# Patient Record
Sex: Female | Born: 1975
Health system: Southern US, Community
[De-identification: ages and names within clinical notes are randomized; demographics above are authoritative.]

## PROBLEM LIST (undated history)

## (undated) DIAGNOSIS — F0390 Unspecified dementia without behavioral disturbance: Secondary | ICD-10-CM

## (undated) DIAGNOSIS — M5030 Other cervical disc degeneration, unspecified cervical region: Secondary | ICD-10-CM

## (undated) DIAGNOSIS — G43909 Migraine, unspecified, not intractable, without status migrainosus: Secondary | ICD-10-CM

## (undated) DIAGNOSIS — I10 Essential (primary) hypertension: Secondary | ICD-10-CM

## (undated) DIAGNOSIS — F419 Anxiety disorder, unspecified: Secondary | ICD-10-CM

## (undated) HISTORY — DX: Migraine, unspecified, not intractable, without status migrainosus: G43.909

---

## 2002-03-02 ENCOUNTER — Encounter: Payer: Self-pay | Admitting: Emergency Medicine

## 2002-03-02 ENCOUNTER — Emergency Department (HOSPITAL_COMMUNITY): Admission: EM | Admit: 2002-03-02 | Discharge: 2002-03-02 | Payer: Self-pay | Admitting: Emergency Medicine

## 2003-02-24 ENCOUNTER — Emergency Department (HOSPITAL_COMMUNITY): Admission: EM | Admit: 2003-02-24 | Discharge: 2003-02-24 | Payer: Self-pay | Admitting: Emergency Medicine

## 2011-09-29 ENCOUNTER — Emergency Department (HOSPITAL_COMMUNITY): Payer: 59

## 2011-09-29 ENCOUNTER — Encounter (HOSPITAL_COMMUNITY): Payer: Self-pay | Admitting: Emergency Medicine

## 2011-09-29 ENCOUNTER — Emergency Department (HOSPITAL_COMMUNITY)
Admission: EM | Admit: 2011-09-29 | Discharge: 2011-09-29 | Disposition: A | Discharge status: Home / Self Care | Payer: 59 | Attending: Emergency Medicine | Admitting: Emergency Medicine

## 2011-09-29 DIAGNOSIS — F3289 Other specified depressive episodes: Secondary | ICD-10-CM | POA: Insufficient documentation

## 2011-09-29 DIAGNOSIS — Z79899 Other long term (current) drug therapy: Secondary | ICD-10-CM | POA: Insufficient documentation

## 2011-09-29 DIAGNOSIS — F17200 Nicotine dependence, unspecified, uncomplicated: Secondary | ICD-10-CM | POA: Insufficient documentation

## 2011-09-29 DIAGNOSIS — F329 Major depressive disorder, single episode, unspecified: Secondary | ICD-10-CM | POA: Insufficient documentation

## 2011-09-29 DIAGNOSIS — F419 Anxiety disorder, unspecified: Secondary | ICD-10-CM

## 2011-09-29 DIAGNOSIS — R209 Unspecified disturbances of skin sensation: Secondary | ICD-10-CM | POA: Insufficient documentation

## 2011-09-29 DIAGNOSIS — F411 Generalized anxiety disorder: Secondary | ICD-10-CM | POA: Insufficient documentation

## 2011-09-29 DIAGNOSIS — R259 Unspecified abnormal involuntary movements: Secondary | ICD-10-CM | POA: Insufficient documentation

## 2011-09-29 DIAGNOSIS — R079 Chest pain, unspecified: Secondary | ICD-10-CM | POA: Insufficient documentation

## 2011-09-29 DIAGNOSIS — N390 Urinary tract infection, site not specified: Secondary | ICD-10-CM | POA: Insufficient documentation

## 2011-09-29 HISTORY — DX: Anxiety disorder, unspecified: F41.9

## 2011-09-29 LAB — BASIC METABOLIC PANEL
Calcium: 9.4 mg/dL (ref 8.4–10.5)
Creatinine, Ser: 0.65 mg/dL (ref 0.50–1.10)
GFR calc non Af Amer: >90 mL/min (ref >90)
Glucose, Bld: 80 mg/dL (ref 70–99)
Sodium: 136 mEq/L (ref 135–145)

## 2011-09-29 LAB — URINALYSIS, ROUTINE W REFLEX MICROSCOPIC
Bilirubin Urine: NEGATIVE
Glucose, UA: NEGATIVE mg/dL
Ketones, ur: NEGATIVE mg/dL
Protein, ur: NEGATIVE mg/dL
pH: 7.5 (ref 5.0–8.0)

## 2011-09-29 LAB — URINE MICROSCOPIC-ADD ON

## 2011-09-29 LAB — POCT I-STAT TROPONIN I

## 2011-09-29 MED ORDER — LORAZEPAM 1 MG PO TABS: 1.0000 mg | ORAL_TABLET | Freq: Three times a day (TID) | ORAL | Status: AC

## 2011-09-29 MED ORDER — LORAZEPAM 2 MG/ML IJ SOLN
1.0000 mg | Freq: Once | INTRAMUSCULAR | Status: AC
  Administered 2011-09-29: 1 mg via INTRAVENOUS
  Filled 2011-09-29: qty 1

## 2011-09-29 MED ORDER — CEPHALEXIN 500 MG PO CAPS: 500.0000 mg | ORAL_CAPSULE | Freq: Four times a day (QID) | ORAL | Status: AC

## 2011-09-29 NOTE — ED Notes (Signed)
Patient tearful, stating she has intermittent, sharp chest pain since yesterday. Patient reports h/o anxiety. Appears anxious. IV initiated and 1 mg ativan given per order. Patient lying on stretcher, family at bedside. Denies any other needs at this time.

## 2011-09-29 NOTE — ED Provider Notes (Signed)
History     CSN: [id number]  Arrival date & time 09/29/11  1055   First MD Initiated Contact with Patient 09/29/11 1102      Chief Complaint  Patient presents with  . Chest Pain    (Consider location/radiation/quality/duration/timing/severity/associated sxs/prior treatment) HPI Comments: Jamie Henderson presents for assistance with intermittent sharp chest pain, shaking, crying and anxiety which has been worsened since yesterday.  She has been diagnosed with anxiety and depression last month by her PCP and had been on Celexa and Xanax, but is currently being weaned off of Celexa for replacement with Zoloft at the Celexa has not seemed to have improved her symptoms.  She reports increased stressors including recent separation, she is currently living with her parent in her children, and is also unable to obtain gainful employment.  She reports intermittent sharp stabs of very localized pain which radiates which radiates into her left upper shoulder which has been intermittent but more frequent over the past 24 hours.  She denies any sharp pain today, but she does report soreness to palpation at the site.  She has intermittent episodes of shortness of breath in association with numbness in her fingertips.  She denies dizziness or lightheadedness, no fevers or chills, diaphoresis.  She does have chronic nausea but denies abdominal pain.  She has decreased appetite but this is chronic since being placed on her Celexa.  She denies any significant weight loss.  Past medical history is otherwise negative.  Family history is significant for mother with anxiety but also with thyroid disorder.  Patient is a 36 y.o. female presenting with chest pain. The history is provided by the patient.  Chest Pain Pertinent negatives for primary symptoms include no fever, no shortness of breath, no abdominal pain, no nausea and no dizziness.  Associated symptoms include numbness.  Pertinent negatives for associated symptoms  include no weakness.     Past Medical History  Diagnosis Date  . Anxiety     Past Surgical History  Procedure Date  . Cesarean section     History reviewed. No pertinent family history.  History  Substance Use Topics  . Smoking status: Current Everyday Smoker    Types: Cigarettes  . Smokeless tobacco: Not on file  . Alcohol Use: No    OB History    Grav Para Term Preterm Abortions TAB SAB Ect Mult Living                  Review of Systems  Constitutional: Negative for fever.  HENT: Negative for congestion, sore throat and neck pain.   Eyes: Negative.   Respiratory: Negative for chest tightness and shortness of breath.   Cardiovascular: Positive for chest pain.  Gastrointestinal: Negative for nausea and abdominal pain.  Genitourinary: Negative.   Musculoskeletal: Negative for joint swelling and arthralgias.  Skin: Negative.  Negative for rash and wound.  Neurological: Positive for tremors and numbness. Negative for dizziness, weakness, light-headedness and headaches.  Hematological: Negative.   Psychiatric/Behavioral: The patient is nervous/anxious.     Allergies  Review of patient's allergies indicates no known allergies.  Home Medications   Current Outpatient Rx  Name Route Sig Dispense Refill  . ALPRAZOLAM 0.5 MG PO TABS Oral Take 0.5 mg by mouth every 6 (six) hours as needed. For panic or anxiety    . MAGIC MOUTHWASH Oral Take 10 mLs by mouth 3 (three) times daily.    Marland Kitchen CARBOXYMETHYLCELLULOSE SODIUM 0.5 % OP SOLN Both Eyes Place 1  drop into both eyes 2 (two) times daily as needed. For dry eye    . CITALOPRAM HYDROBROMIDE 20 MG PO TABS Oral Take 10 mg by mouth daily.    . SERTRALINE HCL 50 MG PO TABS Oral Take 50 mg by mouth daily.    . CEPHALEXIN 500 MG PO CAPS Oral Take 1 capsule (500 mg total) by mouth 4 (four) times daily. 20 capsule 0  . LORAZEPAM 1 MG PO TABS Oral Take 1 tablet (1 mg total) by mouth every 8 (eight) hours. 6 tablet 0    BP 122/80   Pulse 72  Temp(Src) 99.1 F (37.3 C) (Oral)  Resp 20  Ht 5' 2.5" (1.588 m)  Wt 133 lb 8 oz (60.555 kg)  BMI 24.03 kg/m2  SpO2 100%  LMP 09/16/2011  Physical Exam  Nursing note and vitals reviewed. Constitutional: She appears well-developed and well-nourished.  HENT:  Head: Normocephalic and atraumatic.  Eyes: Conjunctivae are normal.  Neck: Normal range of motion.  Cardiovascular: Normal rate, regular rhythm, normal heart sounds and intact distal pulses.   Pulmonary/Chest: Effort normal and breath sounds normal. She has no wheezes.  Abdominal: Soft. Bowel sounds are normal. There is no tenderness.  Musculoskeletal: Normal range of motion.  Neurological: She is alert. She displays tremor.  Skin: Skin is warm and dry.  Psychiatric: Her behavior is normal. Thought content normal. Her mood appears anxious. Her speech is not rapid and/or pressured. She exhibits a depressed mood. She expresses no homicidal and no suicidal ideation.       Patient is tearful.    ED Course  Procedures (including critical care time)  Labs Reviewed  BASIC METABOLIC PANEL - Abnormal; Notable for the following:    BUN 4 (*)    All other components within normal limits  URINALYSIS, ROUTINE W REFLEX MICROSCOPIC - Abnormal; Notable for the following:    Specific Gravity, Urine <1.005 (*)    Nitrite POSITIVE (*)    Leukocytes, UA TRACE (*)    All other components within normal limits  URINE MICROSCOPIC-ADD ON - Abnormal; Notable for the following:    Squamous Epithelial / LPF FEW (*)    Bacteria, UA MANY (*)    All other components within normal limits  PREGNANCY, URINE  POCT I-STAT TROPONIN I  TSH   Dg Chest 2 View  09/29/2011  *RADIOLOGY REPORT*  Clinical Data: Chest pain.  CHEST - 2 VIEW  Comparison: No priors.  Findings: Lungs appear mildly hyperexpanded.  No focal airspace consolidation.  No pleural effusions.  Pulmonary vasculature and the cardiomediastinal silhouette are within normal limits.   IMPRESSION: 1.  Mild hyperexpansion of the lungs without other acute findings. This is nonspecific, but can be seen in the setting of reactive airway disease.  Original Report Authenticated By: Florencia Reasons, M.D.     1. UTI (lower urinary tract infection)   2. Anxiety      Date: 09/29/2011  Rate: 72  Rhythm: normal sinus rhythm  QRS Axis: normal  Intervals: normal  ST/T Wave abnormalities: normal  Conduction Disutrbances:none  Narrative Interpretation:   Old EKG Reviewed: none available  Patient feels much improved after receiving ativan 1 mg PO.     MDM  Patient is perc negative and with normal vital signs,  Making PE far less likely than anxiety and stress as source of sx.    Xrays and labs reviewed prior to dc home.  Keflex prescribed for uti,  Encouraged increasing fluids.  Recheck for any fevers or development of flank pain and/or vomiting.  Pt given short course of ativan in place of xanax and instructed to call pcp in 2 days (Monday) for a recheck of sx.         Burgess Amor, [place] 09/29/11 [phone]

## 2011-09-29 NOTE — ED Notes (Signed)
Patient stating she is feeling better and "is tired of laying in this bed". Burgess Amor, PA made aware.

## 2011-09-29 NOTE — ED Notes (Signed)
Pt c/o central chest pain that is sharp since yesterday. Pt states she was recently diagnosed with anxiety/panic attacks.

## 2011-09-29 NOTE — Discharge Instructions (Signed)
Anxiety and Panic Attacks Your caregiver has informed you that you are having an anxiety or panic attack. There may be many forms of this. Most of the time these attacks come suddenly and without warning. They come at any time of [place], including periods of sleep, and at any time of life. They may be strong and unexplained. Although panic attacks are very scary, they are physically harmless. Sometimes the cause of your anxiety is not known. Anxiety is a protective mechanism of the body in its fight or flight mechanism. Most of these perceived danger situations are actually nonphysical situations (such as anxiety over losing a job). CAUSES  The causes of an anxiety or panic attack are many. Panic attacks may occur in otherwise healthy people given a certain set of circumstances. There may be a genetic cause for panic attacks. Some medications may also have anxiety as a side effect. SYMPTOMS  Some of the most common feelings are:  Intense terror.   Dizziness, feeling faint.   Hot and cold flashes.   Fear of going crazy.   Feelings that nothing is real.   Sweating.   Shaking.   Chest pain or a fast heartbeat (palpitations).   Smothering, choking sensations.   Feelings of impending doom and that death is near.   Tingling of extremities, this may be from over-breathing.   Altered reality (derealization).   Being detached from yourself (depersonalization).  Several symptoms can be present to make up anxiety or panic attacks. DIAGNOSIS  The evaluation by your caregiver will depend on the type of symptoms you are experiencing. The diagnosis of anxiety or panic attack is made when no physical illness can be determined to be a cause of the symptoms. TREATMENT  Treatment to prevent anxiety and panic attacks may include:  Avoidance of circumstances that cause anxiety.   Reassurance and relaxation.   Regular exercise.   Relaxation therapies, such as yoga.   Psychotherapy with a  psychiatrist or therapist.   Avoidance of caffeine, alcohol and illegal drugs.   Prescribed medication.  SEEK IMMEDIATE MEDICAL CARE IF:   You experience panic attack symptoms that are different than your usual symptoms.   You have any worsening or concerning symptoms.  Document Released: 04/30/2005 Document Revised: 04/19/2011 Document Reviewed: 09/01/2009 [facility] Patient Information 2012 [place], [place].Urinary Tract Infection Infections of the urinary tract can start in several places. A bladder infection (cystitis), a kidney infection (pyelonephritis), and a prostate infection (prostatitis) are different types of urinary tract infections (UTIs). They usually get better if treated with medicines (antibiotics) that kill germs. Take all the medicine until it is gone. You or your child may feel better in a few days, but TAKE ALL MEDICINE or the infection may not respond and may become more difficult to treat. HOME CARE INSTRUCTIONS   Drink enough water and fluids to keep the urine clear or pale yellow. Cranberry juice is especially recommended, in addition to large amounts of water.   Avoid caffeine, tea, and carbonated beverages. They tend to irritate the bladder.   Alcohol may irritate the prostate.   Only take over-the-counter or prescription medicines for pain, discomfort, or fever as directed by your caregiver.  To prevent further infections:  Empty the bladder often. Avoid holding urine for long periods of time.   After a bowel movement, women should cleanse from front to back. Use each tissue only once.   Empty the bladder before and after sexual intercourse.  FINDING OUT THE RESULTS OF YOUR  TEST Not all test results are available during your visit. If your or your child's test results are not back during the visit, make an appointment with your caregiver to find out the results. Do not assume everything is normal if you have not heard from your caregiver or the medical  facility. It is important for you to follow up on all test results. SEEK MEDICAL CARE IF:   There is back pain.   Your baby is older than 3 months with a rectal temperature of 100.5 F (38.1 C) or higher for more than 1 Sche[place].   Your or your child's problems (symptoms) are no better in 3 days. Return sooner if you or your child is getting worse.  SEEK IMMEDIATE MEDICAL CARE IF:   There is severe back pain or lower abdominal pain.   You or your child develops chills.   You have a fever.   Your baby is older than 3 months with a rectal temperature of 102 F (38.9 C) or higher.   Your baby is 56 months old or younger with a rectal temperature of 100.4 F (38 C) or higher.   There is nausea or vomiting.   There is continued burning or discomfort with urination.  MAKE SURE YOU:   Understand these instructions.   Will watch your condition.   Will get help right away if you are not doing well or get worse.  Document Released: 02/07/2005 Document Revised: 04/19/2011 Document Reviewed: 09/12/2006 [facility] Patient Information 2012 [place], [place].   You may use the Ativan as prescribed in place of your Xanax for the next couple of days which may give you increased relief from your anxiety.  Use the antibiotic as prescribed to clear your urinary tract infection.  Make sure you're drinking plenty of fluids.   call you doctor for lab visit to recheck your urine after your antibiotic is completed, in the interim get rechecked sooner for any increased urinary symptoms or fever.  Please refer to the resource guide below for obtaining additional care for your anxiety, you may also ask Dr. Neita Carp to be referred to a counselor for this.  In the interim return here if you have any worsened symptoms.     RESOURCE GUIDE  Dental Problems  Patients with Medicaid: [facility] [phone] [address]                                           [phone] [address]  [employer] Phone:  [phone]                                                  Phone:  [phone]  If unable to pay or uninsured, contact:  [facility] or [facility]. to become qualified for the adult dental clinic.  Chronic Pain Problems Contact [facility]  [phone] Patients need to be referred by their primary care doctor.  Insufficient Money for Medicine Contact [employer]:  call "211" or [facility] [phone].  No Primary Care Doctor Call [facility]  [phone] Other agencies that provide  inexpensive medical care    [facility]  [phone]    [facility]  [phone]    [facility]  [phone]    [facility]  [phone]    [facility]  3126456945    [facility]  [phone]  Psychological Services [facility]  [phone] [facility]  [phone] [facility]   [phone] (emergency services [phone])  Substance Abuse Resources [facility]  8608673257 [facility] [phone] [facility] [place] [phone] [facility] [phone] Residential & Outpatient Substance Abuse Program  [phone]  Abuse/Neglect [facility] Child Abuse Hotline (701)651-3994 [facility] Child Abuse Hotline (854) 619-8255 (After Hours)  Emergency Shelter [facility] ([phone]  Maternity Homes Room at the [place] [facility] [place] [phone] [facility] [phone]  MRSA Hotline #:   (272) 070-0479    [facility] Resources  [facility] [place]     [employer]                          [facility] Dept. [address] [place]                       [address]      [address]  Blondell Reveal Phone:  [phone]                                   Phone:  [phone]                  Phone:  [phone]  [facility] Phone:  [phone]  [facility] Child Abuse Hotline [phone] (After Hours)

## 2011-09-29 NOTE — ED Notes (Signed)
EDP at bedside. Discussed plan of care and follow up instructions. Pt verbalized understanding.

## 2011-09-30 NOTE — ED Provider Notes (Signed)
Medical screening examination/treatment/procedure(s) were performed by non-physician practitioner and as supervising physician I was immediately available for consultation/collaboration.  Results for orders placed during the hospital encounter of 09/29/11  BASIC METABOLIC PANEL      Component Value Range   Sodium 136  135 - 145 (mEq/L)   Potassium 3.7  3.5 - 5.1 (mEq/L)   Chloride 103  96 - 112 (mEq/L)   CO2 24  19 - 32 (mEq/L)   Glucose, Bld 80  70 - 99 (mg/dL)   BUN 4 (*) 6 - 23 (mg/dL)   Creatinine, Ser 1.61  0.50 - 1.10 (mg/dL)   Calcium 9.4  8.4 - 09.6 (mg/dL)   GFR calc non Af Amer >90  >90 (mL/min)   GFR calc Af Amer >90  >90 (mL/min)  URINALYSIS, ROUTINE W REFLEX MICROSCOPIC      Component Value Range   Color, Urine YELLOW  YELLOW    APPearance CLEAR  CLEAR    Specific Gravity, Urine <1.005 (*) 1.005 - 1.030    pH 7.5  5.0 - 8.0    Glucose, UA NEGATIVE  NEGATIVE (mg/dL)   Hgb urine dipstick NEGATIVE  NEGATIVE    Bilirubin Urine NEGATIVE  NEGATIVE    Ketones, ur NEGATIVE  NEGATIVE (mg/dL)   Protein, ur NEGATIVE  NEGATIVE (mg/dL)   Urobilinogen, UA 0.2  0.0 - 1.0 (mg/dL)   Nitrite POSITIVE (*) NEGATIVE    Leukocytes, UA TRACE (*) NEGATIVE   PREGNANCY, URINE      Component Value Range   Preg Test, Ur NEGATIVE  NEGATIVE   POCT I-STAT TROPONIN I      Component Value Range   Troponin i, poc 0.00  0.00 - 0.08 (ng/mL)   Comment 3           URINE MICROSCOPIC-ADD ON      Component Value Range   Squamous Epithelial / LPF FEW (*) RARE    WBC, UA 3-6  <3 (WBC/hpf)   Bacteria, UA MANY (*) RARE    Tn negative.     Shelda Jakes, MD 09/30/11 [phone]

## 2012-03-22 ENCOUNTER — Encounter (HOSPITAL_COMMUNITY): Payer: Self-pay | Admitting: *Deleted

## 2012-03-22 ENCOUNTER — Emergency Department (HOSPITAL_COMMUNITY)
Admission: EM | Admit: 2012-03-22 | Discharge: 2012-03-22 | Disposition: A | Discharge status: Home / Self Care | Payer: MEDICAID | Attending: Emergency Medicine | Admitting: Emergency Medicine

## 2012-03-22 DIAGNOSIS — F411 Generalized anxiety disorder: Secondary | ICD-10-CM | POA: Insufficient documentation

## 2012-03-22 DIAGNOSIS — G44309 Post-traumatic headache, unspecified, not intractable: Secondary | ICD-10-CM

## 2012-03-22 DIAGNOSIS — Z79899 Other long term (current) drug therapy: Secondary | ICD-10-CM | POA: Insufficient documentation

## 2012-03-22 DIAGNOSIS — F0781 Postconcussional syndrome: Secondary | ICD-10-CM | POA: Insufficient documentation

## 2012-03-22 DIAGNOSIS — F17200 Nicotine dependence, unspecified, uncomplicated: Secondary | ICD-10-CM | POA: Insufficient documentation

## 2012-03-22 DIAGNOSIS — R42 Dizziness and giddiness: Secondary | ICD-10-CM | POA: Insufficient documentation

## 2012-03-22 DIAGNOSIS — M6281 Muscle weakness (generalized): Secondary | ICD-10-CM | POA: Insufficient documentation

## 2012-03-22 DIAGNOSIS — R112 Nausea with vomiting, unspecified: Secondary | ICD-10-CM | POA: Insufficient documentation

## 2012-03-22 MED ORDER — ONDANSETRON HCL 4 MG/2ML IJ SOLN
4.0000 mg | Freq: Once | INTRAMUSCULAR | Status: AC
  Administered 2012-03-22: 4 mg via INTRAVENOUS
  Filled 2012-03-22: qty 2

## 2012-03-22 MED ORDER — ONDANSETRON HCL 8 MG PO TABS: 8.0000 mg | ORAL_TABLET | Freq: Four times a day (QID) | ORAL | Status: DC

## 2012-03-22 MED ORDER — SODIUM CHLORIDE 0.9 % IV BOLUS (SEPSIS)
1000.0000 mL | Freq: Once | INTRAVENOUS | Status: AC
  Administered 2012-03-22: 1000 mL via INTRAVENOUS

## 2012-03-22 MED ORDER — FENTANYL CITRATE 0.05 MG/ML IJ SOLN
100.0000 ug | Freq: Once | INTRAMUSCULAR | Status: AC
  Administered 2012-03-22: 100 ug via INTRAVENOUS
  Filled 2012-03-22: qty 2
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
## 2012-03-22 NOTE — ED Notes (Signed)
Tolerating fluids w/out nausea.
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
## 2012-03-24 NOTE — ED Provider Notes (Signed)
Medical screening examination/treatment/procedure(s) were performed by non-physician practitioner and as supervising physician I was immediately available for consultation/collaboration.   Laray Anger, DO 03/24/12 1346

## 2013-04-21 ENCOUNTER — Ambulatory Visit (INDEPENDENT_AMBULATORY_CARE_PROVIDER_SITE_OTHER): Payer: Medicaid Other | Admitting: General Practice

## 2013-04-21 ENCOUNTER — Encounter: Payer: Self-pay | Admitting: General Practice

## 2013-04-21 VITALS — BP 129/88 | HR 78 | Temp 99.7°F | Ht 62.5 in | Wt 121.5 lb

## 2013-04-21 DIAGNOSIS — F329 Major depressive disorder, single episode, unspecified: Secondary | ICD-10-CM

## 2013-04-21 DIAGNOSIS — G43909 Migraine, unspecified, not intractable, without status migrainosus: Secondary | ICD-10-CM

## 2013-04-21 DIAGNOSIS — K219 Gastro-esophageal reflux disease without esophagitis: Secondary | ICD-10-CM

## 2013-04-21 DIAGNOSIS — Z7189 Other specified counseling: Secondary | ICD-10-CM

## 2013-04-21 DIAGNOSIS — F411 Generalized anxiety disorder: Secondary | ICD-10-CM

## 2013-04-21 DIAGNOSIS — Z7689 Persons encountering health services in other specified circumstances: Secondary | ICD-10-CM

## 2013-04-21 MED ORDER — OMEPRAZOLE 20 MG PO CPDR: DELAYED_RELEASE_CAPSULE | ORAL | Status: DC

## 2013-04-21 MED ORDER — PROPRANOLOL HCL 10 MG PO TABS: 10.0000 mg | ORAL_TABLET | Freq: Two times a day (BID) | ORAL | Status: DC

## 2013-04-21 NOTE — Patient Instructions (Signed)
Migraine Headache A migraine headache is an intense, throbbing pain on one or both sides of your head. A migraine can last for 30 minutes to several hours. CAUSES  The exact cause of a migraine headache is not always known. However, a migraine may be caused when nerves in the brain become irritated and release chemicals that cause inflammation. This causes pain. SYMPTOMS  Pain on one or both sides of your head.  Pulsating or throbbing pain.  Severe pain that prevents daily activities.  Pain that is aggravated by any physical activity.  Nausea, vomiting, or both.  Dizziness.  Pain with exposure to bright lights, loud noises, or activity.  General sensitivity to bright lights, loud noises, or smells. Before you get a migraine, you may get warning signs that a migraine is coming (aura). An aura may include:  Seeing flashing lights.  Seeing bright spots, halos, or zig-zag lines.  Having tunnel vision or blurred vision.  Having feelings of numbness or tingling.  Having trouble talking.  Having muscle weakness. MIGRAINE TRIGGERS  Alcohol.  Smoking.  Stress.  Menstruation.  Aged cheeses.  Foods or drinks that contain nitrates, glutamate, aspartame, or tyramine.  Lack of sleep.  Chocolate.  Caffeine.  Hunger.  Physical exertion.  Fatigue.  Medicines used to treat chest pain (nitroglycerine), birth control pills, estrogen, and some blood pressure medicines. DIAGNOSIS  A migraine headache is often diagnosed based on:  Symptoms.  Physical examination.  A CT scan or MRI of your head. TREATMENT Medicines may be given for pain and nausea. Medicines can also be given to help prevent recurrent migraines.  HOME CARE INSTRUCTIONS  Only take over-the-counter or prescription medicines for pain or discomfort as directed by your caregiver. The use of long-term narcotics is not recommended.  Lie down in a dark, quiet room when you have a migraine.  Keep a journal  to find out what may trigger your migraine headaches. For example, write down:  What you eat and drink.  How much sleep you get.  Any change to your diet or medicines.  Limit alcohol consumption.  Quit smoking if you smoke.  Get 7 to 9 hours of sleep, or as recommended by your caregiver.  Limit stress.  Keep lights dim if bright lights bother you and make your migraines worse. SEEK IMMEDIATE MEDICAL CARE IF:   Your migraine becomes severe.  You have a fever.  You have a stiff neck.  You have vision loss.  You have muscular weakness or loss of muscle control.  You start losing your balance or have trouble walking.  You feel faint or pass out.  You have severe symptoms that are different from your first symptoms. MAKE SURE YOU:   Understand these instructions.  Will watch your condition.  Will get help right away if you are not doing well or get worse. Document Released: 04/30/2005 Document Revised: 07/23/2011 Document Reviewed: 04/20/2011 [employer] Patient Information 2014 [employer].  

## 2013-04-21 NOTE — Progress Notes (Signed)
   Subjective:    Patient ID: Jamie [facility], female    DOB: 03/23/1976, 37 y.o.   MRN: [id number]  HPI Patient presents today to establish care. She reports a history of migraine headaches, anxiety, depression, and gerd. She reports being seen by [facility] and another family practice prior to that. Reports being seen in the past by [facility], but wasn't happy with medications prescribed. [facility] also reports being seen at a [facility] in [place] and again wasn't satisfied with medications prescribed. She reports currently taking paroxetine, propranolol, and clonazepam. She verbalized the clonazepam isn't effective and she takes more frequently than prescribed; therefore needs script filled sooner than allowed. Reports xanax was most effective for her, but was changed by one of the counseling centers she was seen at.     Review of Systems  Constitutional: Negative for fever and chills.  Respiratory: Negative for chest tightness and shortness of breath.   Cardiovascular: Negative for chest pain and palpitations.  Gastrointestinal: Negative for nausea, vomiting, abdominal pain, diarrhea, constipation and blood in stool.  Genitourinary: Negative for dysuria and difficulty urinating.  Neurological: Negative for dizziness, weakness and headaches.  Psychiatric/Behavioral: Negative for suicidal ideas and self-injury. The patient is nervous/anxious.        Objective:   Physical Exam  Constitutional: She is oriented to person, place, and time. She appears well-developed and well-nourished.  HENT:  Head: Normocephalic and atraumatic.  Right Ear: External ear normal.  Left Ear: External ear normal.  Eyes: EOM are normal. Pupils are equal, round, and reactive to light.  Neck: Normal range of motion. Neck supple. No thyromegaly present.  Cardiovascular: Normal rate, regular rhythm and normal heart sounds.   Pulmonary/Chest: Effort normal and breath sounds normal. No respiratory  distress. She exhibits no tenderness.  Abdominal: Soft. Bowel sounds are normal. She exhibits no distension. There is no tenderness.  Lymphadenopathy:    She has no cervical adenopathy.  Neurological: She is alert and oriented to person, place, and time.  Skin: Skin is warm and dry.  Psychiatric: She has a normal mood and affect.          Assessment & Plan:  1. GAD (generalized anxiety disorder), 2. Depression -discussed and provided information sheets of counseling centers -patient to contact [facility]  -discussed that medication to help manage depression and anxiety should be managed by [facility]  3. Migraine  - propranolol (INDERAL) 10 MG tablet; Take 1 tablet (10 mg total) by mouth 2 (two) times daily.  Dispense: 60 tablet; Refill: 5  4. GERD (gastroesophageal reflux disease)  - omeprazole (PRILOSEC) 20 MG capsule; Take 20 mg by mouth daily. One capsule every AM before eating  Dispense: 30 capsule; Refill: 5  5. Establish care Discussed with patient that acute care issues and wellness exam could be managed at this office -RTO if symptoms worsen -Patient verbalized understanding and in agreement Jamie Keens, FNP-C

## 2013-05-13 ENCOUNTER — Emergency Department (HOSPITAL_COMMUNITY): Payer: Medicaid Other

## 2013-05-13 ENCOUNTER — Encounter (HOSPITAL_COMMUNITY): Payer: Self-pay | Admitting: Emergency Medicine

## 2013-05-13 ENCOUNTER — Emergency Department (HOSPITAL_COMMUNITY)
Admission: EM | Admit: 2013-05-13 | Discharge: 2013-05-13 | Disposition: A | Discharge status: Home / Self Care | Payer: Medicaid Other | Attending: Emergency Medicine | Admitting: Emergency Medicine

## 2013-05-13 DIAGNOSIS — F17200 Nicotine dependence, unspecified, uncomplicated: Secondary | ICD-10-CM | POA: Insufficient documentation

## 2013-05-13 DIAGNOSIS — M5412 Radiculopathy, cervical region: Secondary | ICD-10-CM

## 2013-05-13 DIAGNOSIS — R202 Paresthesia of skin: Secondary | ICD-10-CM

## 2013-05-13 DIAGNOSIS — G43909 Migraine, unspecified, not intractable, without status migrainosus: Secondary | ICD-10-CM | POA: Insufficient documentation

## 2013-05-13 DIAGNOSIS — Z79899 Other long term (current) drug therapy: Secondary | ICD-10-CM | POA: Insufficient documentation

## 2013-05-13 DIAGNOSIS — F411 Generalized anxiety disorder: Secondary | ICD-10-CM | POA: Insufficient documentation

## 2013-05-13 DIAGNOSIS — R209 Unspecified disturbances of skin sensation: Secondary | ICD-10-CM | POA: Insufficient documentation

## 2013-05-13 DIAGNOSIS — M6281 Muscle weakness (generalized): Secondary | ICD-10-CM | POA: Insufficient documentation

## 2013-05-13 LAB — CBC WITH DIFFERENTIAL/PLATELET
Basophils Absolute: 0.1 10*3/uL (ref 0.0–0.1)
Eosinophils Relative: 2 % (ref 0–5)
Lymphocytes Relative: 25 % (ref 12–46)
Lymphs Abs: 2.1 10*3/uL (ref 0.7–4.0)
MCV: 79.8 fL (ref 78.0–100.0)
Neutro Abs: 5.8 10*3/uL (ref 1.7–7.7)
Neutrophils Relative %: 67 % (ref 43–77)
Platelets: 233 10*3/uL (ref 150–400)
RBC: 4.25 MIL/uL (ref 3.87–5.11)
RDW: 15 % (ref 11.5–15.5)
WBC: 8.6 10*3/uL (ref 4.0–10.5)

## 2013-05-13 LAB — BASIC METABOLIC PANEL
CO2: 25 mEq/L (ref 19–32)
Calcium: 9.2 mg/dL (ref 8.4–10.5)
Chloride: 102 mEq/L (ref 96–112)
Glucose, Bld: 90 mg/dL (ref 70–99)
Potassium: 4.6 mEq/L (ref 3.7–5.3)
Sodium: 139 mEq/L (ref 137–147)

## 2013-05-13 MED ORDER — CYCLOBENZAPRINE HCL 10 MG PO TABS: 10.0000 mg | ORAL_TABLET | Freq: Three times a day (TID) | ORAL | Status: DC | PRN

## 2013-05-13 MED ORDER — PREDNISONE 20 MG PO TABS: 60.0000 mg | ORAL_TABLET | Freq: Every day | ORAL | Status: DC

## 2013-05-13 MED ORDER — NAPROXEN 500 MG PO TABS: 500.0000 mg | ORAL_TABLET | Freq: Two times a day (BID) | ORAL | Status: DC

## 2013-05-13 NOTE — ED Notes (Signed)
Left arm numbness started x 2 days ago.numbness started radiating into left side of face/jaw. Smile symmetrical, alert/oriented to all, follows commands. NAD at this time.pt c/o left side neck pain "pinches"

## 2013-05-13 NOTE — ED Provider Notes (Signed)
CSN: [id number]     Arrival date & time 05/13/13  1008 History   This chart was scribed for Charles B. Bernette Mayers, MD, by Yevette Edwards, ED Scribe. This patient was seen in room APA05/AP[facility]05 and the patient's care was started at 11:02 AM.  None    Chief Complaint  Patient presents with  . Numbness   The history is provided by the patient and the spouse. No language interpreter was used.   HPI Comments: [facility] is a 37 y.o. female, with a h/o anxiety, who presents to the Emergency Department complaining of gradually-increasing numbness and paresthesia diffusely to her left arm which began two days and which has radiated to the left side of her face and jaw as well as into her left leg. She reports the sensation of numbness increased yesterday evening. The pt is also experiencing pain to her left neck. She states she is also experiencing weakness to her left arm, though she is still able to use the arm normally. She denies any neck surgeries. She also denies any recent injuries or falls. The pt denies a h/o DM, HTN, or increased cholesterol. She is a current smoker.   Dr. Miguel Dibble is her PCP.   Past Medical History  Diagnosis Date  . Anxiety   . Migraine    Past Surgical History  Procedure Laterality Date  . Cesarean section     Family History  Problem Relation Age of Onset  . Diabetes Mother   . COPD Mother   . Arthritis Mother    History  Substance Use Topics  . Smoking status: Current Every Lardizabal Smoker    Types: Cigarettes  . Smokeless tobacco: Not on file  . Alcohol Use: No   No OB history provided.  Review of Systems  A complete 10 system review of systems was obtained, and all systems were negative except where indicated in the HPI and PE.   Allergies  Review of patient's allergies indicates no known allergies.  Home Medications   Current Outpatient Rx  Name  Route  Sig  Dispense  Refill  . acetaminophen (TYLENOL) 500 MG tablet   Oral   Take 1,000 mg by  mouth every 6 (six) hours as needed. For headaches         . ALPRAZolam (XANAX) 0.5 MG tablet   Oral   Take 0.5 mg by mouth 4 (four) times daily as needed. For anxiety         . clonazePAM (KLONOPIN) 1 MG tablet   Oral   Take by mouth. Take 1 to 2 times daily         . ibuprofen (ADVIL,MOTRIN) 200 MG tablet   Oral   Take 800 mg by mouth every 6 (six) hours as needed. For headaches         . omeprazole (PRILOSEC) 20 MG capsule      Take 20 mg by mouth daily. One capsule every AM before eating   30 capsule   5   . ondansetron (ZOFRAN) 8 MG tablet   Oral   Take 1 tablet (8 mg total) by mouth every 6 (six) hours.   12 tablet   0   . PARoxetine (PAXIL) 20 MG tablet   Oral   Take 20 mg by mouth daily.         . propranolol (INDERAL) 10 MG tablet   Oral   Take 1 tablet (10 mg total) by mouth 2 (two) times daily.  60 tablet   5   . sertraline (ZOLOFT) 50 MG tablet   Oral   Take 75 mg by mouth Daily. Take 75 mg by mouth daily.          Trige Vitals: BP 147/85  Pulse 80  Temp(Src) 98.6 F (37 C) (Oral)  Resp 18  SpO2 100%  LMP 05/02/2013  Physical Exam  Nursing note and vitals reviewed. Constitutional: She is oriented to person, place, and time. She appears well-developed and well-nourished.  HENT:  Head: Normocephalic and atraumatic.  Eyes: EOM are normal. Pupils are equal, round, and reactive to light.  Neck: Normal range of motion. Neck supple.  Cardiovascular: Normal rate, normal heart sounds and intact distal pulses.   Pulmonary/Chest: Effort normal and breath sounds normal.  Abdominal: Bowel sounds are normal. She exhibits no distension. There is no tenderness.  Musculoskeletal: Normal range of motion. She exhibits no edema and no tenderness.  Neurological: She is alert and oriented to person, place, and time. She has normal strength and normal reflexes. A sensory deficit (Subjective paresthesia to left face and left arm. ) is present. No cranial  nerve deficit.  Skin: Skin is warm and dry. No rash noted.  Psychiatric: She has a normal mood and affect.    ED Course  Procedures (including critical care time)  DIAGNOSTIC STUDIES: Oxygen Saturation is 100% on room air, normal by my interpretation.    COORDINATION OF CARE:  11:08 AM- Discussed treatment plan with patient, and the patient agreed to the plan.   Labs Review Labs Reviewed  CBC WITH DIFFERENTIAL - Abnormal; Notable for the following:    Hemoglobin 11.0 (*)    HCT 33.9 (*)    MCH 25.9 (*)    All other components within normal limits  BASIC METABOLIC PANEL   Imaging Review Ct Head Wo Contrast  05/13/2013   CLINICAL DATA:  Left arm and facial tingling.  EXAM: CT HEAD WITHOUT CONTRAST  TECHNIQUE: Contiguous axial images were obtained from the base of the skull through the vertex without intravenous contrast.  COMPARISON:  None.  FINDINGS: Bony calvarium appears intact. No mass effect or midline shift is noted. Ventricular size is within normal limits. There is no evidence of mass lesion, hemorrhage or acute infarction.  IMPRESSION: No gross intracranial abnormality seen.   Electronically Signed   By: Roque Lias M.D.   On: 05/13/2013 12:22    EKG Interpretation   None       MDM   1. Paresthesia   2. Cervical radiculopathy     No objective neuro deficits. Symptoms are primarily paresthesia and likely related to cervical radiculopathy. Doubt this is stroke in low risk patient. She has neg CT after 3 days of symptoms. She also states she 'felt something release' in her neck and symptoms improved. Will d/c with muscle relaxer, prednisone. PCP followup.   I personally performed the services described in this documentation, which was scribed in my presence. The recorded information has been reviewed and is accurate.      Charles B. Bernette Mayers, MD 05/13/13 [id number]

## 2013-05-29 ENCOUNTER — Other Ambulatory Visit: Payer: Self-pay | Admitting: General Practice

## 2013-05-29 ENCOUNTER — Telehealth: Payer: Self-pay | Admitting: General Practice

## 2013-05-29 NOTE — Telephone Encounter (Signed)
She needs to get this script filled by original prescriber.

## 2013-05-29 NOTE — Telephone Encounter (Signed)
Patient aware.

## 2013-06-04 ENCOUNTER — Telehealth: Payer: Self-pay | Admitting: General Practice

## 2013-06-04 MED ORDER — FIRST-DUKES MOUTHWASH MT SUSP: 10.0000 mL | Freq: Four times a day (QID) | OROMUCOSAL | Status: DC

## 2013-06-04 NOTE — Telephone Encounter (Signed)
rx sent to pharmacy

## 2013-06-05 NOTE — Telephone Encounter (Signed)
Aware. 

## 2013-06-09 ENCOUNTER — Telehealth: Payer: Self-pay | Admitting: Nurse Practitioner

## 2013-06-09 ENCOUNTER — Other Ambulatory Visit: Payer: Self-pay | Admitting: Nurse Practitioner

## 2013-06-09 MED ORDER — FIRST-DUKES MOUTHWASH MT SUSP: 10.0000 mL | Freq: Four times a day (QID) | OROMUCOSAL | Status: DC

## 2013-06-16 ENCOUNTER — Ambulatory Visit: Payer: Medicaid Other | Admitting: General Practice

## 2018-09-29 ENCOUNTER — Other Ambulatory Visit: Payer: Self-pay

## 2018-09-30 ENCOUNTER — Ambulatory Visit: Payer: Self-pay | Admitting: Family Medicine

## 2018-10-09 ENCOUNTER — Other Ambulatory Visit: Payer: Self-pay

## 2018-10-10 ENCOUNTER — Other Ambulatory Visit: Payer: Self-pay

## 2018-10-10 ENCOUNTER — Encounter: Payer: Self-pay | Admitting: Family Medicine

## 2018-10-10 ENCOUNTER — Ambulatory Visit (INDEPENDENT_AMBULATORY_CARE_PROVIDER_SITE_OTHER): Payer: Self-pay | Admitting: Family Medicine

## 2018-10-10 VITALS — BP 134/67 | HR 96 | Temp 98.3°F | Ht 62.5 in | Wt 114.0 lb

## 2018-10-10 DIAGNOSIS — F411 Generalized anxiety disorder: Secondary | ICD-10-CM

## 2018-10-10 DIAGNOSIS — F339 Major depressive disorder, recurrent, unspecified: Secondary | ICD-10-CM | POA: Insufficient documentation

## 2018-10-10 DIAGNOSIS — Z98891 History of uterine scar from previous surgery: Secondary | ICD-10-CM | POA: Insufficient documentation

## 2018-10-10 MED ORDER — FLUOXETINE HCL 40 MG PO CAPS: 40.0000 mg | ORAL_CAPSULE | Freq: Every day | ORAL | 0 refills | Status: DC

## 2018-10-10 NOTE — Patient Instructions (Signed)
If your symptoms worsen or you have thoughts of suicide/homicide, PLEASE SEEK IMMEDIATE MEDICAL ATTENTION.  You may always call the National Suicide Hotline.  This is available 24 hours a [place], 7 days a week.  Their number is: 1-800-273-8255  Taking the medicine as directed and not missing any doses is one of the best things you can do to treat your depression.  Here are some things to keep in mind:  1) Side effects (stomach upset, some increased anxiety) may happen before you notice a benefit.  These side effects typically go away over time. 2) Changes to your dose of medicine or a change in medication all together is sometimes necessary 3) Most people need to be on medication at least 12 months 4) Many people will notice an improvement within two weeks but the full effect of the medication can take up to 4-6 weeks 5) Stopping the medication when you start feeling better often results in a return of symptoms 6) Never discontinue your medication without contacting a health care professional first.  Some medications require gradual discontinuation/ taper and can make you sick if you stop them abruptly.  If your symptoms worsen or you have thoughts of suicide/homicide, PLEASE SEEK IMMEDIATE MEDICAL ATTENTION.  You may always call:  National Suicide Hotline: 800-273-8255 [place] Crisis Line: 336-832-9700 Crisis Recovery in [place]: 800-939-5911   These are available 24 hours a [place], 7 days a week.   

## 2018-10-10 NOTE — Progress Notes (Addendum)
Subjective:  Patient ID: Jamie Henderson, female    DOB: 07/24/1975, 43 y.o.   MRN: [id number]  Chief Complaint:  New Patient (Initial Visit) (anxiety and due a pap)   HPI: Jamie Henderson is a 43 y.o. female presenting on 10/10/2018 for New Patient (Initial Visit) (anxiety and due a pap)  Pt presents today to establish care. Pt states she has not been seen by a PCP since 2015 when she went to prison. Pt states the only thing she was prescribed while in prison was Excedrin Migraine for her headaches. Pt states she has not been on her depression or anxiety medications in a long time. Pt states she was going to [facility] for counseling after getting out of prison. States she has not been in a while due to transportation issues. Pt states she feels overwhelmed and anxious all of the time. She denies SI or HI. States she has been staying with her ex husband since her release. States this is challenging at times.  Pt states she needs to have a physical. Pt aware we will set up an appointment for a CPE with PAP.   GAD 7 : Generalized Anxiety Score 10/10/2018  Nervous, Anxious, on Edge 3  Control/stop worrying 3  Worry too much - different things 3  Trouble relaxing 3  Restless 3  Easily annoyed or irritable 2  Afraid - awful might happen 2  Total GAD 7 Score 19    Depression screen PHQ 2/9 10/10/2018  Decreased Interest 1  Down, Depressed, Hopeless 3  PHQ - 2 Score 4  Altered sleeping 1  Tired, decreased energy 1  Change in appetite 3  Feeling bad or failure about yourself  1  Trouble concentrating 2  Moving slowly or fidgety/restless 3  Suicidal thoughts 0  PHQ-9 Score 15     Relevant past medical, surgical, family, and social history reviewed and updated as indicated.  Allergies and medications reviewed and updated.   Past Medical History:  Diagnosis Date  . Anxiety   . Migraine     Past Surgical History:  Procedure Laterality Date  . CESAREAN SECTION      Social History    Socioeconomic History  . Marital status: Married    Spouse name: Not on file  . Number of children: Not on file  . Years of education: Not on file  . Highest education level: Not on file  Occupational History  . Not on file  Social Needs  . Financial resource strain: Not on file  . Food insecurity:    Worry: Not on file    Inability: Not on file  . Transportation needs:    Medical: Not on file    Non-medical: Not on file  Tobacco Use  . Smoking status: Current Every [place] Smoker    Types: Cigarettes  . Smokeless tobacco: Never Used  Substance and Sexual Activity  . Alcohol use: No  . Drug use: No  . Sexual activity: Yes    Birth control/protection: None  Lifestyle  . Physical activity:    Days per week: Not on file    Minutes per session: Not on file  . Stress: Not on file  Relationships  . Social connections:    Talks on phone: Not on file    Gets together: Not on file    Attends religious service: Not on file    Active member of club or organization: Not on file    Attends  meetings of clubs or organizations: Not on file    Relationship status: Not on file  . Intimate partner violence:    Fear of current or ex partner: Not on file    Emotionally abused: Not on file    Physically abused: Not on file    Forced sexual activity: Not on file  Other Topics Concern  . Not on file  Social History Narrative  . Not on file    Outpatient Encounter Medications as of 10/10/2018  Medication Sig  . FLUoxetine (PROZAC) 40 MG capsule Take 1 capsule (40 mg total) by mouth daily.  . [DISCONTINUED] clonazePAM (KLONOPIN) 1 MG tablet Take 2 mg by mouth daily as needed for anxiety. Take 1 to 2 times daily  . [DISCONTINUED] cyclobenzaprine (FLEXERIL) 10 MG tablet Take 1 tablet (10 mg total) by mouth 3 (three) times daily as needed for muscle spasms.  . [DISCONTINUED] Diphenhyd-Hydrocort-Nystatin (FIRST-DUKES MOUTHWASH) SUSP Use as directed 10 mLs in the mouth or throat 4 (four) times  daily.  . [DISCONTINUED] naproxen (NAPROSYN) 500 MG tablet Take 1 tablet (500 mg total) by mouth 2 (two) times daily.  . [DISCONTINUED] naproxen sodium (ANAPROX) 220 MG tablet Take 440 mg by mouth daily as needed (headache).  . [DISCONTINUED] omeprazole (PRILOSEC) 20 MG capsule Take 20 mg by mouth daily. One capsule every AM before eating  . [DISCONTINUED] PARoxetine (PAXIL) 20 MG tablet Take 20 mg by mouth daily.  . [DISCONTINUED] predniSONE (DELTASONE) 20 MG tablet Take 3 tablets (60 mg total) by mouth daily.  . [DISCONTINUED] propranolol (INDERAL) 10 MG tablet Take 1 tablet (10 mg total) by mouth 2 (two) times daily.  . [DISCONTINUED] SUMAtriptan (IMITREX) 25 MG tablet Take 25 mg by mouth every 2 (two) hours as needed for migraine or headache. May repeat in 2 hours if headache persists or recurs.   No facility-administered encounter medications on file as of 10/10/2018.     No Known Allergies  Review of Systems  Constitutional: Positive for appetite change and fatigue. Negative for chills, fever and unexpected weight change.  Eyes: Negative for photophobia and visual disturbance.  Respiratory: Negative for cough and shortness of breath.   Cardiovascular: Negative for chest pain, palpitations and leg swelling.  Gastrointestinal: Negative for abdominal pain, constipation, diarrhea, nausea and vomiting.  Genitourinary: Negative for decreased urine volume, difficulty urinating, vaginal bleeding, vaginal discharge and vaginal pain.  Musculoskeletal: Negative for arthralgias and myalgias.  Neurological: Positive for headaches. Negative for dizziness, tremors, seizures, syncope, facial asymmetry, speech difficulty, weakness, light-headedness and numbness.  Psychiatric/Behavioral: Positive for agitation, behavioral problems, decreased concentration, dysphoric mood and sleep disturbance. Negative for confusion, hallucinations, self-injury and suicidal ideas. The patient is nervous/anxious. The  patient is not hyperactive.   All other systems reviewed and are negative.       Objective:  BP 134/67   Pulse 96   Temp 98.3 F (36.8 C) (Oral)   Ht 5' 2.5" (1.588 m)   Wt 114 lb (51.7 kg)   BMI 20.52 kg/m    Wt Readings from Last 3 Encounters:  10/10/18 114 lb (51.7 kg)  04/21/13 121 lb 8 oz (55.1 kg)  03/22/12 125 lb (56.7 kg)    Physical Exam Vitals signs and nursing note reviewed.  Constitutional:      General: She is not in acute distress.    Appearance: Normal appearance. She is well-developed, well-groomed and normal weight. She is not ill-appearing or toxic-appearing.  HENT:     Head: Normocephalic and  atraumatic.     Jaw: There is normal jaw occlusion.     Right Ear: Hearing, tympanic membrane, ear canal and external ear normal.     Left Ear: Hearing, tympanic membrane, ear canal and external ear normal.     Nose: Nose normal.     Mouth/Throat:     Lips: Pink.     Mouth: Mucous membranes are moist.     Pharynx: Oropharynx is clear. Uvula midline.  Eyes:     General: Lids are normal.     Conjunctiva/sclera: Conjunctivae normal.     Pupils: Pupils are equal, round, and reactive to light.  Neck:     Musculoskeletal: Full passive range of motion without pain and neck supple.     Thyroid: No thyroid mass, thyromegaly or thyroid tenderness.     Vascular: No carotid bruit or JVD.     Trachea: Trachea and phonation normal.  Cardiovascular:     Rate and Rhythm: Normal rate and regular rhythm.     Pulses: Normal pulses.     Heart sounds: Normal heart sounds. No murmur. No friction rub. No gallop.   Pulmonary:     Effort: Pulmonary effort is normal. No respiratory distress.     Breath sounds: Normal breath sounds.  Musculoskeletal:     Right lower leg: No edema.     Left lower leg: No edema.  Skin:    General: Skin is warm and dry.     Capillary Refill: Capillary refill takes less than 2 seconds.  Neurological:     General: No focal deficit present.      Mental Status: She is alert and oriented to person, place, and time.  Psychiatric:        Attention and Perception: Attention and perception normal.        Mood and Affect: Mood is anxious. Affect is tearful.        Speech: Speech normal.        Behavior: Behavior normal. Behavior is cooperative.        Thought Content: Thought content normal.        Cognition and Memory: Cognition and memory normal.        Judgment: Judgment normal.     Results for orders placed or performed during the hospital encounter of 05/13/13  CBC with Differential  Result Value Ref Range   WBC 8.6 4.0 - 10.5 K/uL   RBC 4.25 3.87 - 5.11 MIL/uL   Hemoglobin 11.0 (L) 12.0 - 15.0 g/dL   HCT 84.8 (L) 59.2 - 76.3 %   MCV 79.8 78.0 - 100.0 fL   MCH 25.9 (L) 26.0 - 34.0 pg   MCHC 32.4 30.0 - 36.0 g/dL   RDW 94.3 20.0 - 37.9 %   Platelets 233 150 - 400 K/uL   Neutrophils Relative % 67 43 - 77 %   Neutro Abs 5.8 1.7 - 7.7 K/uL   Lymphocytes Relative 25 12 - 46 %   Lymphs Abs 2.1 0.7 - 4.0 K/uL   Monocytes Relative 6 3 - 12 %   Monocytes Absolute 0.5 0.1 - 1.0 K/uL   Eosinophils Relative 2 0 - 5 %   Eosinophils Absolute 0.2 0.0 - 0.7 K/uL   Basophils Relative 1 0 - 1 %   Basophils Absolute 0.1 0.0 - 0.1 K/uL  Basic metabolic panel  Result Value Ref Range   Sodium 139 137 - 147 mEq/L   Potassium 4.6 3.7 - 5.3 mEq/L   Chloride 102  96 - 112 mEq/L   CO2 25 19 - 32 mEq/L   Glucose, Bld 90 70 - 99 mg/dL   BUN 7 6 - 23 mg/dL   Creatinine, Ser 1.61 0.50 - 1.10 mg/dL   Calcium 9.2 8.4 - 09.6 mg/dL   GFR calc non Af Amer >90 >90 mL/min   GFR calc Af Amer >90 >90 mL/min       Pertinent labs & imaging results that were available during my care of the patient were reviewed by me and considered in my medical decision making.  Assessment & Plan:  Ahley was seen today for new patient (initial visit).  Diagnoses and all orders for this visit:  GAD (generalized anxiety disorder) Depression, recurrent (HCC)  Ongoing depression and anxiety. Has not been on medications since 2015. Will initiate Prozac today. Pt to follow up with [facility] for continued counseling. Report any new or worsening symptoms. Crisis hotline number provided.  -     TSH -     FLUoxetine (PROZAC) 40 MG capsule; Take 1 capsule (40 mg total) by mouth daily.   Will schedule an appointment in 2 weeks for CPE and follow up.  Continue all other maintenance medications.  Follow up plan: Return in about 2 weeks (around 10/24/2018), or if symptoms worsen or fail to improve, for CPE with PAP.  Educational handout given for depression   The above assessment and management plan was discussed with the patient. The patient verbalized understanding of and has agreed to the management plan. Patient is aware to call the clinic if symptoms persist or worsen. Patient is aware when to return to the clinic for a follow-up visit. Patient educated on when it is appropriate to go to the emergency department.   Kari Baars, FNP-C [facility] [phone]

## 2018-10-11 LAB — TSH: TSH: 1.52 u[IU]/mL (ref 0.450–4.500)

## 2018-10-14 NOTE — Progress Notes (Signed)
I will fix. Thanks. I am not sure what happened.

## 2018-10-28 ENCOUNTER — Telehealth: Payer: Self-pay | Admitting: Family Medicine

## 2018-10-28 NOTE — Telephone Encounter (Signed)
Patient states that prozac was helping at first but now feesl like she has more anxiety, trouble sleeping, and shaky for the last 2 weeks. NO thoughts about hurting herself or anyone else.   Patient would like to know what she needs to do.  Has appt on 6/23

## 2018-10-28 NOTE — Telephone Encounter (Signed)
We can adjust her medications at her appointment. She can try benadryl 12.5-25 mg every 8 hours as needed for anxiety until her appointment time.

## 2018-10-28 NOTE — Telephone Encounter (Signed)
Patient aware.

## 2018-11-03 ENCOUNTER — Telehealth: Payer: Self-pay | Admitting: Family Medicine

## 2018-11-03 ENCOUNTER — Other Ambulatory Visit: Payer: Self-pay

## 2018-11-03 NOTE — Telephone Encounter (Signed)
lmtcb

## 2018-11-03 NOTE — Telephone Encounter (Signed)
Pt needs to speak with Rakes about anxiety med

## 2018-11-04 ENCOUNTER — Ambulatory Visit: Payer: Self-pay | Admitting: Nurse Practitioner

## 2018-11-04 ENCOUNTER — Encounter: Payer: Self-pay | Admitting: Family Medicine

## 2018-11-04 ENCOUNTER — Encounter: Payer: Self-pay | Admitting: Nurse Practitioner

## 2018-11-04 VITALS — BP 108/67 | HR 79 | Temp 99.0°F | Ht 62.0 in | Wt 113.0 lb

## 2018-11-04 DIAGNOSIS — F411 Generalized anxiety disorder: Secondary | ICD-10-CM

## 2018-11-04 MED ORDER — BUSPIRONE HCL 15 MG PO TABS: 15.0000 mg | ORAL_TABLET | Freq: Two times a day (BID) | ORAL | 1 refills | Status: DC

## 2018-11-04 NOTE — Patient Instructions (Signed)

## 2018-11-04 NOTE — Progress Notes (Signed)
   Subjective:    Patient ID: Jamie Henderson, female    DOB: 1975-06-18, 43 y.o.   MRN: [id number]   Chief Complaint: Anxiety   HPI Patient come sin today for follow up. She was seen in the office for the first time on 10/10/18. She had just gotten out of prison and was very anxious. She was started on prozac. She was doing well on it but she says the more she takes it the more anxious she becomes. She has been having nightmares of being back in prison. She can't keep her thoughts together. She cant calm down.   GAD 7 : Generalized Anxiety Score 11/04/2018 10/10/2018  Nervous, Anxious, on Edge 3 3  Control/stop worrying 3 3  Worry too much - different things 3 3  Trouble relaxing 3 3  Restless 2 3  Easily annoyed or irritable 2 2  Afraid - awful might happen 2 2  Total GAD 7 Score 18 19  Anxiety Difficulty Very difficult -    Depression screen [facility] 2/9 11/04/2018 10/10/2018  Decreased Interest 2 1  Down, Depressed, Hopeless 2 3  PHQ - 2 Score 4 4  Altered sleeping 1 1  Tired, decreased energy 2 1  Change in appetite 1 3  Feeling bad or failure about yourself  2 1  Trouble concentrating 2 2  Moving slowly or fidgety/restless 3 3  Suicidal thoughts 0 0  PHQ-9 Score 15 15     Review of Systems  Constitutional: Negative.   Respiratory: Negative.   Cardiovascular: Negative.   Hematological: Negative.   All other systems reviewed and are negative.      Objective:   Physical Exam Vitals signs and nursing note reviewed.  Constitutional:      General: She is not in acute distress.    Appearance: Normal appearance.  Cardiovascular:     Rate and Rhythm: Normal rate and regular rhythm.     Heart sounds: Normal heart sounds.  Pulmonary:     Breath sounds: Normal breath sounds.  Neurological:     General: No focal deficit present.     Mental Status: She is alert and oriented to person, place, and time.  Psychiatric:        Attention and Perception: Attention normal.        Mood  and Affect: Mood is anxious.        Behavior: Behavior is hyperactive. Behavior is cooperative.     Comments: figidity Talking very fast.    BP 108/67   Pulse 79   Temp 99 F (37.2 C) (Oral)   Ht 5\' 2"  (1.575 m)   Wt 113 lb (51.3 kg)   BMI 20.67 kg/m         Assessment & Plan:  Dalaya A Santori in today with chief complaint of Anxiety   1. GAD (generalized anxiety disorder) Stress management Continue prozac Follow up with M. Rakes on June 30 as scheduled - busPIRone (BUSPAR) 15 MG tablet; Take 1 tablet (15 mg total) by mouth 2 (two) times daily.  Dispense: 60 tablet; Refill: [place], FNP

## 2018-11-04 NOTE — Addendum Note (Signed)
Addended by: Chevis Pretty on: 11/04/2018 03:06 PM   Modules accepted: Level of Service

## 2018-11-06 ENCOUNTER — Other Ambulatory Visit: Payer: Self-pay | Admitting: *Deleted

## 2018-11-06 DIAGNOSIS — F411 Generalized anxiety disorder: Secondary | ICD-10-CM

## 2018-11-06 MED ORDER — BUSPIRONE HCL 15 MG PO TABS: 15.0000 mg | ORAL_TABLET | Freq: Two times a day (BID) | ORAL | 1 refills | Status: DC

## 2018-11-11 ENCOUNTER — Encounter: Payer: Self-pay | Admitting: Family Medicine

## 2018-11-19 ENCOUNTER — Other Ambulatory Visit: Payer: Self-pay

## 2018-11-20 ENCOUNTER — Encounter: Payer: Self-pay | Admitting: Family Medicine

## 2018-11-20 ENCOUNTER — Ambulatory Visit (INDEPENDENT_AMBULATORY_CARE_PROVIDER_SITE_OTHER): Payer: Self-pay | Admitting: Family Medicine

## 2018-11-20 VITALS — BP 133/85 | HR 85 | Temp 98.7°F | Ht 62.0 in | Wt 109.0 lb

## 2018-11-20 DIAGNOSIS — Z1239 Encounter for other screening for malignant neoplasm of breast: Secondary | ICD-10-CM

## 2018-11-20 DIAGNOSIS — Z0001 Encounter for general adult medical examination with abnormal findings: Secondary | ICD-10-CM

## 2018-11-20 DIAGNOSIS — D729 Disorder of white blood cells, unspecified: Secondary | ICD-10-CM

## 2018-11-20 DIAGNOSIS — R748 Abnormal levels of other serum enzymes: Secondary | ICD-10-CM

## 2018-11-20 DIAGNOSIS — R5383 Other fatigue: Secondary | ICD-10-CM

## 2018-11-20 DIAGNOSIS — F411 Generalized anxiety disorder: Secondary | ICD-10-CM

## 2018-11-20 DIAGNOSIS — D508 Other iron deficiency anemias: Secondary | ICD-10-CM

## 2018-11-20 DIAGNOSIS — Z0000 Encounter for general adult medical examination without abnormal findings: Secondary | ICD-10-CM

## 2018-11-20 DIAGNOSIS — Z114 Encounter for screening for human immunodeficiency virus [HIV]: Secondary | ICD-10-CM

## 2018-11-20 DIAGNOSIS — R87610 Atypical squamous cells of undetermined significance on cytologic smear of cervix (ASC-US): Secondary | ICD-10-CM

## 2018-11-20 DIAGNOSIS — F339 Major depressive disorder, recurrent, unspecified: Secondary | ICD-10-CM

## 2018-11-20 DIAGNOSIS — R634 Abnormal weight loss: Secondary | ICD-10-CM

## 2018-11-20 DIAGNOSIS — Z124 Encounter for screening for malignant neoplasm of cervix: Secondary | ICD-10-CM

## 2018-11-20 DIAGNOSIS — D649 Anemia, unspecified: Secondary | ICD-10-CM

## 2018-11-20 MED ORDER — BUSPIRONE HCL 15 MG PO TABS: 15.0000 mg | ORAL_TABLET | Freq: Three times a day (TID) | ORAL | 3 refills | Status: DC

## 2018-11-20 MED ORDER — FLUOXETINE HCL 40 MG PO CAPS: 40.0000 mg | ORAL_CAPSULE | Freq: Every day | ORAL | 3 refills | Status: DC

## 2018-11-20 NOTE — Patient Instructions (Signed)
 Preventive Care 21-41 Years Old, Female Preventive care refers to visits with your health care provider and lifestyle choices that can promote health and wellness. This includes:  A yearly physical exam. This may also be called an annual well check.  Regular dental visits and eye exams.  Immunizations.  Screening for certain conditions.  Healthy lifestyle choices, such as eating a healthy diet, getting regular exercise, not using drugs or products that contain nicotine and tobacco, and limiting alcohol use. What can I expect for my preventive care visit? Physical exam Your health care provider will check your:  Height and weight. This may be used to calculate body mass index (BMI), which tells if you are at a healthy weight.  Heart rate and blood pressure.  Skin for abnormal spots. Counseling Your health care provider may ask you questions about your:  Alcohol, tobacco, and drug use.  Emotional well-being.  Home and relationship well-being.  Sexual activity.  Eating habits.  Work and work environment.  Method of birth control.  Menstrual cycle.  Pregnancy history. What immunizations do I need?  Influenza (flu) vaccine  This is recommended every year. Tetanus, diphtheria, and pertussis (Tdap) vaccine  You may need a Td booster every 10 years. Varicella (chickenpox) vaccine  You may need this if you have not been vaccinated. Human papillomavirus (HPV) vaccine  If recommended by your health care provider, you may need three doses over 6 months. Measles, mumps, and rubella (MMR) vaccine  You may need at least one dose of MMR. You may also need a second dose. Meningococcal conjugate (MenACWY) vaccine  One dose is recommended if you are age 19-21 years and a first-year college student living in a residence hall, or if you have one of several medical conditions. You may also need additional booster doses. Pneumococcal conjugate (PCV13) vaccine  You may need  this if you have certain conditions and were not previously vaccinated. Pneumococcal polysaccharide (PPSV23) vaccine  You may need one or two doses if you smoke cigarettes or if you have certain conditions. Hepatitis A vaccine  You may need this if you have certain conditions or if you travel or work in places where you may be exposed to hepatitis A. Hepatitis B vaccine  You may need this if you have certain conditions or if you travel or work in places where you may be exposed to hepatitis B. Haemophilus influenzae type b (Hib) vaccine  You may need this if you have certain conditions. You may receive vaccines as individual doses or as more than one vaccine together in one shot (combination vaccines). Talk with your health care provider about the risks and benefits of combination vaccines. What tests do I need?  Blood tests  Lipid and cholesterol levels. These may be checked every 5 years starting at age 20.  Hepatitis C test.  Hepatitis B test. Screening  Diabetes screening. This is done by checking your blood sugar (glucose) after you have not eaten for a while (fasting).  Sexually transmitted disease (STD) testing.  BRCA-related cancer screening. This may be done if you have a family history of breast, ovarian, tubal, or peritoneal cancers.  Pelvic exam and Pap test. This may be done every 3 years starting at age 21. Starting at age 30, this may be done every 5 years if you have a Pap test in combination with an HPV test. Talk with your health care provider about your test results, treatment options, and if necessary, the need for more   tests. Follow these instructions at home: Eating and drinking   Eat a diet that includes fresh fruits and vegetables, whole grains, lean protein, and low-fat dairy.  Take vitamin and mineral supplements as recommended by your health care provider.  Do not drink alcohol if: ? Your health care provider tells you not to drink. ? You are  pregnant, may be pregnant, or are planning to become pregnant.  If you drink alcohol: ? Limit how much you have to 0-1 drink a [place]. ? Be aware of how much alcohol is in your drink. In the [place] one drink equals one 12 oz bottle of beer (355 mL), one 5 oz glass of wine (148 mL), or one 1 oz glass of hard liquor (44 mL). Lifestyle  Take daily care of your teeth and gums.  Stay active. Exercise for at least 30 minutes on 5 or more days each week.  Do not use any products that contain nicotine or tobacco, such as cigarettes, e-cigarettes, and chewing tobacco. If you need help quitting, ask your health care provider.  If you are sexually active, practice safe sex. Use a condom or other form of birth control (contraception) in order to prevent pregnancy and STIs (sexually transmitted infections). If you plan to become pregnant, see your health care provider for a preconception visit. What's next?  Visit your health care provider once a year for a well check visit.  Ask your health care provider how often you should have your eyes and teeth checked.  Stay up to date on all vaccines. This information is not intended to replace advice given to you by your health care provider. Make sure you discuss any questions you have with your health care provider. Document Released: 06/26/2001 Document Revised: 01/09/2018 Document Reviewed: 01/09/2018 Elsevier Patient Education  2020 [employer].

## 2018-11-20 NOTE — Progress Notes (Signed)
Subjective:     Jamie Henderson is a 43 y.o. female and is here for a comprehensive physical exam. The patient reports problems - anxiety, depression, fatigue, and weight loss. She has been taking medications as prescribed. States she does feel slight improvement. States she has a decreased appetite and daily fatigue.  She reports weight loss. States she is not trying to loose weight but she does not feel like eating due to her anxiety. No other complaints or concerns.   Social History   Socioeconomic History  . Marital status: Married    Spouse name: Not on file  . Number of children: Not on file  . Years of education: Not on file  . Highest education level: Not on file  Occupational History  . Not on file  Social Needs  . Financial resource strain: Not on file  . Food insecurity    Worry: Not on file    Inability: Not on file  . Transportation needs    Medical: Not on file    Non-medical: Not on file  Tobacco Use  . Smoking status: Current Every Stults Smoker    Packs/Fluhr: 1.50    Years: 15.00    Pack years: 22.50    Types: Cigarettes  . Smokeless tobacco: Never Used  Substance and Sexual Activity  . Alcohol use: No  . Drug use: No  . Sexual activity: Yes    Birth control/protection: None  Lifestyle  . Physical activity    Days per week: Not on file    Minutes per session: Not on file  . Stress: Not on file  Relationships  . Social Herbalist on phone: Not on file    Gets together: Not on file    Attends religious service: Not on file    Active member of club or organization: Not on file    Attends meetings of clubs or organizations: Not on file    Relationship status: Not on file  . Intimate partner violence    Fear of current or ex partner: Not on file    Emotionally abused: Not on file    Physically abused: Not on file    Forced sexual activity: Not on file  Other Topics Concern  . Not on file  Social History Narrative  . Not on file   Health  Maintenance  Topic Date Due  . HIV Screening  11/03/1992  . PAP SMEAR-Modifier  08/15/2010  . INFLUENZA VACCINE  12/13/2018  . TETANUS/TDAP  03/20/2022    The following portions of the patient's history were reviewed and updated as appropriate: allergies, current medications, past family history, past medical history, past social history, past surgical history and problem list.  Review of Systems Constitutional: positive for anorexia, fatigue and weight loss Eyes: negative Ears, nose, mouth, throat, and face: negative Respiratory: negative Cardiovascular: negative Gastrointestinal: positive for nausea Genitourinary:negative Integument/breast: negative Hematologic/lymphatic: negative Musculoskeletal:positive for myalgias Neurological: negative Behavioral/Psych: positive for anxiety, decreased appetite, depression, fatigue, irritability and loss of interest in favorite activities Endocrine: negative Allergic/Immunologic: negative   Objective:    BP 133/85   Pulse 85   Temp 98.7 F (37.1 C) (Oral)   Ht _0  (1.575 m)   Wt 109 lb (49.4 kg)   BMI 19.94 kg/m  General appearance: alert, cooperative and no distress Head: Normocephalic, without obvious abnormality, atraumatic Eyes: conjunctivae/corneas clear. PERRL, EOM's intact. Fundi benign. Ears: normal TM's and external ear canals both ears Nose: Nares  normal. Septum midline. Mucosa normal. No drainage or sinus tenderness. Throat: lips, mucosa, and tongue normal; teeth and gums normal Neck: no adenopathy, no carotid bruit, no JVD, supple, symmetrical, trachea midline and thyroid not enlarged, symmetric, no tenderness/mass/nodules Back: symmetric, no curvature. ROM normal. No CVA tenderness. Lungs: clear to auscultation bilaterally Breasts: normal appearance, no masses or tenderness Heart: regular rate and rhythm, S1, S2 normal, no murmur, click, rub or gallop Abdomen: soft, non-tender; bowel sounds normal; no masses,  no  organomegaly Pelvic: cervix normal in appearance, external genitalia normal, no adnexal masses or tenderness, no cervical motion tenderness, rectovaginal septum normal, uterus normal size, shape, and consistency and vagina normal without discharge Extremities: extremities normal, atraumatic, no cyanosis or edema Pulses: 2+ and symmetric Skin: Skin color, texture, turgor normal. No rashes or lesions Lymph nodes: Cervical, supraclavicular, and axillary nodes normal. Neurologic: Grossly normal    Assessment:  Jamie Henderson was seen today for gynecologic exam.  Diagnoses and all orders for this visit:  Annual physical exam Health maintenance discussed. Diet and exercise encouraged. Labs pending. Mammogram ordered.  -     CBC with Differential/Platelet -     CMP14+EGFR -     Lipid panel -     Thyroid Panel With TSH -     Vitamin B12 -     VITAMIN D 25 Hydroxy (Vit-D Deficiency, Fractures) -     IGP, Aptima HPV, rfx 16/18,45 -     MM 3D SCREEN BREAST BILATERAL; Future -     HIV Antibody (routine testing w rflx)  GAD (generalized anxiety disorder) Stress management discussed. Will increase Buspar to 3 times daily. If not beneficial, will refer to psychiatry.  -     busPIRone (BUSPAR) 15 MG tablet; Take 1 tablet (15 mg total) by mouth 3 (three) times daily. -     FLUoxetine (PROZAC) 40 MG capsule; Take 1 capsule (40 mg total) by mouth daily. -     Thyroid Panel With TSH  Depression, recurrent ([place]) Continue medications as prescribed. If symptoms worsen, will refer to psychiatry.  -     FLUoxetine (PROZAC) 40 MG capsule; Take 1 capsule (40 mg total) by mouth daily. -     Thyroid Panel With TSH  Screening for malignant neoplasm of cervix -     IGP, Aptima HPV, rfx 16/18,45  Breast screening -     MM 3D SCREEN BREAST BILATERAL; Future  Screening for HIV (human immunodeficiency virus) -     HIV Antibody (routine testing w rflx)  Other fatigue Weight loss Ongoing fatigue and weight loss.  Will check below. Try to eat 3 full meals per [place]. Stress management and sleep hygiene discussed.  -     CBC with Differential/Platelet -     CMP14+EGFR -     Vitamin B12 -     VITAMIN D 25 Hydroxy (Vit-D Deficiency, Fractures)       -     Thyroid Panel With TSH   Return in 3 months (on 02/20/2019) for GAD, depression.   The above assessment and management plan was discussed with the patient. The patient verbalized understanding of and has agreed to the management plan. Patient is aware to call the clinic if symptoms fail to improve or worsen. Patient is aware when to return to the clinic for a follow-up visit. Patient educated on when it is appropriate to go to the emergency department.   Monia Pouch, FNP-C [place],  [place] [address] [phone]

## 2018-11-21 ENCOUNTER — Telehealth: Payer: Self-pay | Admitting: *Deleted

## 2018-11-21 ENCOUNTER — Other Ambulatory Visit: Payer: Self-pay | Admitting: *Deleted

## 2018-11-21 DIAGNOSIS — R748 Abnormal levels of other serum enzymes: Secondary | ICD-10-CM

## 2018-11-21 DIAGNOSIS — D649 Anemia, unspecified: Secondary | ICD-10-CM | POA: Insufficient documentation

## 2018-11-21 DIAGNOSIS — F411 Generalized anxiety disorder: Secondary | ICD-10-CM

## 2018-11-21 DIAGNOSIS — R5383 Other fatigue: Secondary | ICD-10-CM | POA: Insufficient documentation

## 2018-11-21 DIAGNOSIS — R634 Abnormal weight loss: Secondary | ICD-10-CM | POA: Insufficient documentation

## 2018-11-21 DIAGNOSIS — D729 Disorder of white blood cells, unspecified: Secondary | ICD-10-CM

## 2018-11-21 LAB — CMP14+EGFR
ALT: 10 IU/L (ref 0–32)
AST: 12 IU/L (ref 0–40)
Albumin/Globulin Ratio: 1.7 (ref 1.2–2.2)
Albumin: 4.2 g/dL (ref 3.8–4.8)
Alkaline Phosphatase: 125 IU/L — ABNORMAL HIGH (ref 39–117)
BUN/Creatinine Ratio: 14 (ref 9–23)
BUN: 8 mg/dL (ref 6–24)
Bilirubin Total: 0.3 mg/dL (ref 0.0–1.2)
CO2: 22 mmol/L (ref 20–29)
Calcium: 9.2 mg/dL (ref 8.7–10.2)
Chloride: 102 mmol/L (ref 96–106)
Creatinine, Ser: 0.59 mg/dL (ref 0.57–1.00)
GFR calc Af Amer: 132 mL/min/{1.73_m2} (ref >59)
GFR calc non Af Amer: 114 mL/min/{1.73_m2} (ref >59)
Globulin, Total: 2.5 g/dL (ref 1.5–4.5)
Glucose: 78 mg/dL (ref 65–99)
Potassium: 4.7 mmol/L (ref 3.5–5.2)
Sodium: 138 mmol/L (ref 134–144)
Total Protein: 6.7 g/dL (ref 6.0–8.5)

## 2018-11-21 LAB — CBC WITH DIFFERENTIAL/PLATELET
Basophils Absolute: 0.1 10*3/uL (ref 0.0–0.2)
Basos: 1 %
EOS (ABSOLUTE): 0.1 10*3/uL (ref 0.0–0.4)
Eos: 1 %
Hematocrit: 31.2 % — ABNORMAL LOW (ref 34.0–46.6)
Hemoglobin: 9.5 g/dL — ABNORMAL LOW (ref 11.1–15.9)
Immature Grans (Abs): 0 10*3/uL (ref 0.0–0.1)
Immature Granulocytes: 0 %
Lymphocytes Absolute: 1.5 10*3/uL (ref 0.7–3.1)
Lymphs: 14 %
MCH: 23.5 pg — ABNORMAL LOW (ref 26.6–33.0)
MCHC: 30.4 g/dL — ABNORMAL LOW (ref 31.5–35.7)
MCV: 77 fL — ABNORMAL LOW (ref 79–97)
Monocytes Absolute: 0.7 10*3/uL (ref 0.1–0.9)
Monocytes: 7 %
Neutrophils Absolute: 8.7 10*3/uL — ABNORMAL HIGH (ref 1.4–7.0)
Neutrophils: 77 %
Platelets: 261 10*3/uL (ref 150–450)
RBC: 4.05 x10E6/uL (ref 3.77–5.28)
RDW: 15 % (ref 11.7–15.4)
WBC: 11.1 10*3/uL — ABNORMAL HIGH (ref 3.4–10.8)

## 2018-11-21 LAB — THYROID PANEL WITH TSH
Free Thyroxine Index: 1.9 (ref 1.2–4.9)
T3 Uptake Ratio: 25 % (ref 24–39)
T4, Total: 7.6 ug/dL (ref 4.5–12.0)
TSH: 1.14 u[IU]/mL (ref 0.450–4.500)

## 2018-11-21 LAB — LIPID PANEL
Chol/HDL Ratio: 5.1 ratio — ABNORMAL HIGH (ref 0.0–4.4)
Cholesterol, Total: 162 mg/dL (ref 100–199)
HDL: 32 mg/dL — ABNORMAL LOW (ref >39)
LDL Calculated: 113 mg/dL — ABNORMAL HIGH (ref 0–99)
Triglycerides: 84 mg/dL (ref 0–149)
VLDL Cholesterol Cal: 17 mg/dL (ref 5–40)

## 2018-11-21 LAB — HIV ANTIBODY (ROUTINE TESTING W REFLEX): HIV Screen 4th Generation wRfx: NONREACTIVE

## 2018-11-21 LAB — VITAMIN D 25 HYDROXY (VIT D DEFICIENCY, FRACTURES): Vit D, 25-Hydroxy: 40.2 ng/mL (ref 30.0–100.0)

## 2018-11-21 LAB — VITAMIN B12: Vitamin B-12: 260 pg/mL (ref 232–1245)

## 2018-11-21 NOTE — Addendum Note (Signed)
Addended by: Earlene Plater on: 11/21/2018 03:33 PM   Modules accepted: Orders

## 2018-11-21 NOTE — Telephone Encounter (Signed)
Attempted to cancel gtt and hgb lab order.  Unable to discontinue.  Patient only needs Ggt and Anemia profile

## 2018-11-21 NOTE — Addendum Note (Signed)
Addended by: Baruch Gouty on: 11/21/2018 01:32 PM   Modules accepted: Orders

## 2018-11-23 LAB — SPECIMEN STATUS REPORT

## 2018-11-23 LAB — FE+TIBC+FER+B12+FOLIC
Ferritin: 6 ng/mL — ABNORMAL LOW (ref 15–150)
Folate: 2.8 ng/mL — ABNORMAL LOW (ref >3.0)
Iron Saturation: 5 % — CL (ref 15–55)
Iron: 17 ug/dL — ABNORMAL LOW (ref 27–159)
Total Iron Binding Capacity: 319 ug/dL (ref 250–450)
UIBC: 302 ug/dL (ref 131–425)
Vitamin B-12: 253 pg/mL (ref 232–1245)

## 2018-11-24 MED ORDER — FERROUS SULFATE 325 (65 FE) MG PO TBEC: 325.0000 mg | DELAYED_RELEASE_TABLET | Freq: Three times a day (TID) | ORAL | 5 refills | Status: DC

## 2018-11-24 NOTE — Addendum Note (Signed)
Addended by: Baruch Gouty on: 11/24/2018 07:50 AM   Modules accepted: Orders

## 2018-11-26 ENCOUNTER — Telehealth: Payer: Self-pay | Admitting: Family Medicine

## 2018-11-26 NOTE — Telephone Encounter (Signed)
Patient aware per lab results

## 2018-11-28 ENCOUNTER — Encounter (HOSPITAL_COMMUNITY): Payer: Self-pay | Admitting: *Deleted

## 2018-12-01 ENCOUNTER — Encounter (HOSPITAL_COMMUNITY): Payer: Self-pay | Admitting: Hematology

## 2018-12-01 ENCOUNTER — Other Ambulatory Visit: Payer: Self-pay

## 2018-12-01 ENCOUNTER — Telehealth: Payer: Self-pay | Admitting: Family Medicine

## 2018-12-01 ENCOUNTER — Inpatient Hospital Stay (HOSPITAL_COMMUNITY): Payer: Self-pay

## 2018-12-01 ENCOUNTER — Inpatient Hospital Stay (HOSPITAL_COMMUNITY): Payer: Self-pay | Attending: Hematology | Admitting: Hematology

## 2018-12-01 VITALS — BP 117/73 | HR 91 | Temp 98.8°F | Resp 18 | Ht 62.0 in | Wt 109.0 lb

## 2018-12-01 DIAGNOSIS — Z79899 Other long term (current) drug therapy: Secondary | ICD-10-CM | POA: Insufficient documentation

## 2018-12-01 DIAGNOSIS — N920 Excessive and frequent menstruation with regular cycle: Secondary | ICD-10-CM | POA: Insufficient documentation

## 2018-12-01 DIAGNOSIS — D509 Iron deficiency anemia, unspecified: Secondary | ICD-10-CM | POA: Insufficient documentation

## 2018-12-01 DIAGNOSIS — D649 Anemia, unspecified: Secondary | ICD-10-CM

## 2018-12-01 DIAGNOSIS — D500 Iron deficiency anemia secondary to blood loss (chronic): Secondary | ICD-10-CM | POA: Insufficient documentation

## 2018-12-01 LAB — IGP, APTIMA HPV, RFX 16/18,45: HPV Aptima: NEGATIVE

## 2018-12-01 LAB — CBC WITH DIFFERENTIAL/PLATELET
Abs Immature Granulocytes: 0.07 10*3/uL (ref 0.00–0.07)
Basophils Absolute: 0.1 10*3/uL (ref 0.0–0.1)
Basophils Relative: 1 %
Eosinophils Absolute: 0.2 10*3/uL (ref 0.0–0.5)
Eosinophils Relative: 2 %
HCT: 34.2 % — ABNORMAL LOW (ref 36.0–46.0)
Hemoglobin: 10.3 g/dL — ABNORMAL LOW (ref 12.0–15.0)
Immature Granulocytes: 1 %
Lymphocytes Relative: 17 %
Lymphs Abs: 2.2 10*3/uL (ref 0.7–4.0)
MCH: 24.5 pg — ABNORMAL LOW (ref 26.0–34.0)
MCHC: 30.1 g/dL (ref 30.0–36.0)
MCV: 81.4 fL (ref 80.0–100.0)
Monocytes Absolute: 0.8 10*3/uL (ref 0.1–1.0)
Monocytes Relative: 6 %
Neutro Abs: 9.9 10*3/uL — ABNORMAL HIGH (ref 1.7–7.7)
Neutrophils Relative %: 73 %
Platelets: 331 10*3/uL (ref 150–400)
RBC: 4.2 MIL/uL (ref 3.87–5.11)
RDW: 17.9 % — ABNORMAL HIGH (ref 11.5–15.5)
WBC: 13.3 10*3/uL — ABNORMAL HIGH (ref 4.0–10.5)
nRBC: 0 % (ref 0.0–0.2)

## 2018-12-01 LAB — LACTATE DEHYDROGENASE: LDH: 169 U/L (ref 98–192)

## 2018-12-01 LAB — RETICULOCYTES
Immature Retic Fract: 12 % (ref 2.3–15.9)
RBC.: 4.2 MIL/uL (ref 3.87–5.11)
Retic Count, Absolute: 59.2 10*3/uL (ref 19.0–186.0)
Retic Ct Pct: 1.4 % (ref 0.4–3.1)

## 2018-12-01 NOTE — Patient Instructions (Addendum)
[facility] Discharge Instructions  You were seen today by Dr. Katragadda. He went over your history, family history and how you've been feeling lately. He will have blood work drawn today. He will see you back in 4 weeks for follow up.   Thank you for choosing [facility] to provide your oncology and hematology care.  To afford each patient quality time with our provider, please arrive at least 15 minutes before your scheduled appointment time.   If you have a lab appointment with the Cancer Center please come in thru the  Main Entrance and check in at the main information desk  You need to re-schedule your appointment should you arrive 10 or more minutes late.  We strive to give you quality time with our providers, and arriving late affects you and other patients whose appointments are after yours.  Also, if you no show three or more times for appointments you may be dismissed from the clinic at the providers discretion.     Again, thank you for choosing [facility].  Our hope is that these requests will decrease the amount of time that you wait before being seen by our physicians.       _____________________________________________________________  Should you have questions after your visit to [facility], please contact our office at [phone] between the hours of 8:00 a.m. and 4:30 p.m.  Voicemails left after 4:00 p.m. will not be returned until the following business [place].  For prescription refill requests, have your pharmacy contact our office and allow 72 hours.    Cancer Center Support Programs:   > Cancer Support Group  2nd Tuesday of the month 1pm-2pm, Journey Room    

## 2018-12-01 NOTE — Assessment & Plan Note (Signed)
1.  Microcytic anemia: - CBC on 11/20/2018 shows hemoglobin 9.5 with an MCV of 77. -Ferritin was low at 6 and percent saturation of 5.  Folic acid was low at 2.8.  B12 was borderline at 253. -Multifactorial anemia due to iron deficiency from blood loss, folic acid deficiency and borderline B12 levels. - She started taking iron tablet daily and One-A-Bob[place] woman's tablet daily 1 week ago.  She denies any constipation. - She has menses 3-6 days every 28 days.  Lately they have become more closer. - She was recently started on BuSpar and Prozac for anxiety, she reportedly lost 5 pounds since the start of those medicines. -Prior to that she had about 15 pound weight loss in the last 2 months secondary to anxiety.  She was in prison for 4 and a few years until 2018. -Denies any history of prior blood transfusions. -We will repeat her CBC with differential today.  We will check her LDH, reticulocyte count, SPEP, methylmalonic acid and serum copper levels today. - If her hemoglobin further drops, I will consider giving parenteral iron therapy with Feraheme.  We talked about side effects of Feraheme including serious anaphylactic reactions. -I will see her back in 4 weeks for follow-up.  2.  Folic acid deficiency: - Her folic acid was 2.8 on 01/13/99. -She is taking One-A-K[place] woman's vitamin daily.  3.  Anxiety: - She did not feel well on BuSpar and Prozac.  She is not taking them.

## 2018-12-01 NOTE — Progress Notes (Signed)
CONSULT NOTE  Patient Care Team: Rakes, Connye Burkitt, FNP as PCP - General (Family Medicine)  CHIEF COMPLAINTS/PURPOSE OF CONSULTATION:  Microcytic anemia  HISTORY OF PRESENTING ILLNESS:  Jamie Henderson 43 y.o. female is seen in consultation today for further work-up and management of microcytic anemia.  Her labs on 11/20/2018 showed hemoglobin of 9.5 with MCV of 77.  Folic acid was 2.8.  B12 was borderline at 253.  Ferritin was low at 6 and percent saturation of 5.  She was feeling very weak.  She does report some trouble swallowing but it mostly happens when she has anxiety.  She was reportedly started on buspirone and Prozac which she took for 2 weeks and did not feel good.  She lost about 5 pounds since the start of medication.  Prior to that she lost about 10 to 15 pounds in the last 2 months secondary to anxiety.  She was reportedly in the prison for 4 and half years until 2018.  She is actively menstruating.  She has 3 to 6 days of bleeding every 28 days.  Lately she reports that her.  Have been merging together.  She never had blood transfusions.  She denies any pica.  Patient started taking over-the-counter iron tablet about a week ago.  She is also taking One-A-[place] woman's tablet daily for 1 week.  Denies any generalized tingling or numbness in extremities.  Reports some numbness in the left hand when she gets anxiety episodes.  Denies any bleeding per rectum or melena.  No hematuria was reported.  No fevers or night sweats reported.   MEDICAL HISTORY:  Past Medical History:  Diagnosis Date  . Anxiety   . Migraine     SURGICAL HISTORY: Past Surgical History:  Procedure Laterality Date  . CESAREAN SECTION      SOCIAL HISTORY: Social History   Socioeconomic History  . Marital status: Married    Spouse name: Not on file  . Number of children: Not on file  . Years of education: Not on file  . Highest education level: Not on file  Occupational History  . Not on file  Social Needs   . Financial resource strain: Not on file  . Food insecurity    Worry: Not on file    Inability: Not on file  . Transportation needs    Medical: Not on file    Non-medical: Not on file  Tobacco Use  . Smoking status: Current Every [place] Smoker    Packs/[place]: 1.50    Years: 15.00    Pack years: 22.50    Types: Cigarettes  . Smokeless tobacco: Never Used  Substance and Sexual Activity  . Alcohol use: No  . Drug use: No  . Sexual activity: Yes    Birth control/protection: None  Lifestyle  . Physical activity    Days per week: Not on file    Minutes per session: Not on file  . Stress: Not on file  Relationships  . Social Herbalist on phone: Not on file    Gets together: Not on file    Attends religious service: Not on file    Active member of club or organization: Not on file    Attends meetings of clubs or organizations: Not on file    Relationship status: Not on file  . Intimate partner violence    Fear of current or ex partner: Not on file    Emotionally abused: Not on file  Physically abused: Not on file    Forced sexual activity: Not on file  Other Topics Concern  . Not on file  Social History Narrative  . Not on file    FAMILY HISTORY: Family History  Problem Relation Age of Onset  . Diabetes Mother   . COPD Mother   . Arthritis Mother     ALLERGIES:  has No Known Allergies.  MEDICATIONS:  Current Outpatient Medications  Medication Sig Dispense Refill  . ferrous sulfate 325 (65 FE) MG EC tablet Take 1 tablet (325 mg total) by mouth 3 (three) times daily with meals. (Patient taking differently: Take 325 mg by mouth 3 (three) times daily with meals. Pt has not picked up prescription but is taking OTC iron 1 tablet daily) 90 tablet 5  . Multiple Vitamin (MULTIVITAMIN) tablet Take 1 tablet by mouth daily.    . busPIRone (BUSPAR) 15 MG tablet Take 1 tablet (15 mg total) by mouth 3 (three) times daily. (Patient not taking: Reported on 12/01/2018) 90  tablet 3  . FLUoxetine (PROZAC) 40 MG capsule Take 1 capsule (40 mg total) by mouth daily. (Patient not taking: Reported on 12/01/2018) 90 capsule 3   No current facility-administered medications for this visit.     REVIEW OF SYSTEMS:   Constitutional: Denies fevers, chills or abnormal night sweats.  Positive for fatigue. Eyes: Denies blurriness of vision, double vision or watery eyes Ears, nose, mouth, throat, and face: Denies mucositis or sore throat Respiratory: Denies cough, dyspnea or wheezes Cardiovascular: Denies palpitation, chest discomfort or lower extremity swelling Gastrointestinal:  Denies nausea, heartburn or change in bowel habits.  Positive for trouble swallowing solids when anxious. Skin: Denies abnormal skin rashes Lymphatics: Denies new lymphadenopathy or easy bruising Neurological:Denies numbness, tingling or new weaknesses.  Positive for anxiety. Behavioral/Psych: Mood is stable, no new changes  All other systems were reviewed with the patient and are negative.  PHYSICAL EXAMINATION: ECOG PERFORMANCE STATUS: 0 - Asymptomatic  Vitals:   12/01/18 1337  BP: 117/73  Pulse: 91  Resp: 18  Temp: 98.8 F (37.1 C)  SpO2: 100%   Filed Weights   12/01/18 1337  Weight: 109 lb (49.4 kg)    GENERAL:alert, no distress and comfortable SKIN: skin color, texture, turgor are normal, no rashes or significant lesions EYES: normal, conjunctiva are pink and non-injected, sclera clear OROPHARYNX:no exudate, no erythema and lips, buccal mucosa, and tongue normal  NECK: supple, thyroid normal size, non-tender, without nodularity LYMPH:  no palpable lymphadenopathy in the cervical, axillary or inguinal LUNGS: clear to auscultation and percussion with normal breathing effort HEART: regular rate & rhythm and no murmurs and no lower extremity edema ABDOMEN:abdomen soft, non-tender and normal bowel sounds Musculoskeletal:no cyanosis of digits and no clubbing  PSYCH: alert &  oriented x 3 with fluent speech NEURO: no focal motor/sensory deficits  LABORATORY DATA:  I have reviewed the data as listed Recent Results (from the past 2160 hour(s))  TSH     Status: None   Collection Time: 10/10/18  1:50 PM  Result Value Ref Range   TSH 1.520 0.450 - 4.500 uIU/mL  CBC with Differential/Platelet     Status: Abnormal   Collection Time: 11/20/18  8:53 AM  Result Value Ref Range   WBC 11.1 (H) 3.4 - 10.8 x10E3/uL   RBC 4.05 3.77 - 5.28 x10E6/uL   Hemoglobin 9.5 (L) 11.1 - 15.9 g/dL   Hematocrit 31.2 (L) 34.0 - 46.6 %   MCV 77 (L) 79 -  97 fL   MCH 23.5 (L) 26.6 - 33.0 pg   MCHC 30.4 (L) 31.5 - 35.7 g/dL   RDW 15.0 11.7 - 15.4 %   Platelets 261 150 - 450 x10E3/uL   Neutrophils 77 Not Estab. %   Lymphs 14 Not Estab. %   Monocytes 7 Not Estab. %   Eos 1 Not Estab. %   Basos 1 Not Estab. %   Neutrophils Absolute 8.7 (H) 1.4 - 7.0 x10E3/uL   Lymphocytes Absolute 1.5 0.7 - 3.1 x10E3/uL   Monocytes Absolute 0.7 0.1 - 0.9 x10E3/uL   EOS (ABSOLUTE) 0.1 0.0 - 0.4 x10E3/uL   Basophils Absolute 0.1 0.0 - 0.2 x10E3/uL   Immature Granulocytes 0 Not Estab. %   Immature Grans (Abs) 0.0 0.0 - 0.1 x10E3/uL  CMP14+EGFR     Status: Abnormal   Collection Time: 11/20/18  8:53 AM  Result Value Ref Range   Glucose 78 65 - 99 mg/dL   BUN 8 6 - 24 mg/dL   Creatinine, Ser 0.59 0.57 - 1.00 mg/dL   GFR calc non Af Amer 114 >59 mL/min/1.73   GFR calc Af Amer 132 >59 mL/min/1.73   BUN/Creatinine Ratio 14 9 - 23   Sodium 138 134 - 144 mmol/L   Potassium 4.7 3.5 - 5.2 mmol/L   Chloride 102 96 - 106 mmol/L   CO2 22 20 - 29 mmol/L   Calcium 9.2 8.7 - 10.2 mg/dL   Total Protein 6.7 6.0 - 8.5 g/dL   Albumin 4.2 3.8 - 4.8 g/dL   Globulin, Total 2.5 1.5 - 4.5 g/dL   Albumin/Globulin Ratio 1.7 1.2 - 2.2   Bilirubin Total 0.3 0.0 - 1.2 mg/dL   Alkaline Phosphatase 125 (H) 39 - 117 IU/L   AST 12 0 - 40 IU/L   ALT 10 0 - 32 IU/L  Lipid panel     Status: Abnormal   Collection Time:  11/20/18  8:53 AM  Result Value Ref Range   Cholesterol, Total 162 100 - 199 mg/dL   Triglycerides 84 0 - 149 mg/dL   HDL 32 (L) >39 mg/dL   VLDL Cholesterol Cal 17 5 - 40 mg/dL   LDL Calculated 113 (H) 0 - 99 mg/dL   Chol/HDL Ratio 5.1 (H) 0.0 - 4.4 ratio    Comment:                                   T. Chol/HDL Ratio                                             Men  Women                               1/2 Avg.Risk  3.4    3.3                                   Avg.Risk  5.0    4.4                                2X Avg.Risk  9.6    7.1  3X Avg.Risk 23.4   11.0   Thyroid Panel With TSH     Status: None   Collection Time: 11/20/18  8:53 AM  Result Value Ref Range   TSH 1.140 0.450 - 4.500 uIU/mL   T4, Total 7.6 4.5 - 12.0 ug/dL   T3 Uptake Ratio 25 24 - 39 %   Free Thyroxine Index 1.9 1.2 - 4.9  Vitamin B12     Status: None   Collection Time: 11/20/18  8:53 AM  Result Value Ref Range   Vitamin B-12 260 232 - 1,245 pg/mL  VITAMIN D 25 Hydroxy (Vit-D Deficiency, Fractures)     Status: None   Collection Time: 11/20/18  8:53 AM  Result Value Ref Range   Vit D, 25-Hydroxy 40.2 30.0 - 100.0 ng/mL    Comment: Vitamin D deficiency has been defined by the Institute of Medicine and an Endocrine Society practice guideline as a level of serum 25-OH vitamin D less than 20 ng/mL (1,2). The Endocrine Society went on to further define vitamin D insufficiency as a level between 21 and 29 ng/mL (2). 1. IOM (Institute of Medicine). 2010. Dietary reference    intakes for calcium and D. [place]: The    [employer]. 2. Holick MF, Binkley Bertie, Bischoff-Ferrari HA, et al.    Evaluation, treatment, and prevention of vitamin D    deficiency: an Endocrine Society clinical practice    guideline. JCEM. 2011 Jul; 96(7):1911-30.   HIV Antibody (routine testing w rflx)     Status: None   Collection Time: 11/20/18  8:53 AM  Result Value Ref Range   HIV Screen  4th Generation wRfx Non Reactive Non Reactive  Fe+TIBC+Fer+B12+Folic     Status: Abnormal   Collection Time: 11/20/18  8:53 AM  Result Value Ref Range   Total Iron Binding Capacity 319 250 - 450 ug/dL   UIBC 302 131 - 425 ug/dL   Iron 17 (L) 27 - 159 ug/dL   Iron Saturation 5 (LL) 15 - 55 %   Ferritin 6 (L) 15 - 150 ng/mL   Vitamin B-12 253 232 - 1,245 pg/mL   Folate 2.8 (L) >3.0 ng/mL    Comment: A serum folate concentration of less than 3.1 ng/mL is considered to represent clinical deficiency.   Specimen status report     Status: None   Collection Time: 11/20/18  8:53 AM  Result Value Ref Range   specimen status report Comment     Comment: Written Authorization Written Authorization Written Authorization Received. Authorization received from [facility] 11-22-2018 Logged by Gelene Mink     RADIOGRAPHIC STUDIES: I have personally reviewed the radiological images as listed and agreed with the findings in the report.  ASSESSMENT & PLAN:  Microcytic anemia 1.  Microcytic anemia: - CBC on 11/20/2018 shows hemoglobin 9.5 with an MCV of 77. -Ferritin was low at 6 and percent saturation of 5.  Folic acid was low at 2.8.  B12 was borderline at 253. -Multifactorial anemia due to iron deficiency from blood loss, folic acid deficiency and borderline B12 levels. - She started taking iron tablet daily and One-A-[place] woman's tablet daily 1 week ago.  She denies any constipation. - She has menses 3-6 days every 28 days.  Lately they have become more closer. - She was recently started on BuSpar and Prozac for anxiety, she reportedly lost 5 pounds since the start of those medicines. -Prior to that she had about 15 pound weight loss in the last  2 months secondary to anxiety.  She was in prison for 4 and a few years until 2018. -Denies any history of prior blood transfusions. -We will repeat her CBC with differential today.  We will check her LDH, reticulocyte count, SPEP, methylmalonic acid and  serum copper levels today. - If her hemoglobin further drops, I will consider giving parenteral iron therapy with Feraheme.  We talked about side effects of Feraheme including serious anaphylactic reactions. -I will see her back in 4 weeks for follow-up.  2.  Folic acid deficiency: - Her folic acid was 2.8 on 05/17/2765. -She is taking One-A-[place] woman's vitamin daily.  3.  Anxiety: - She did not feel well on BuSpar and Prozac.  She is not taking them.     All questions were answered. The patient knows to call the clinic with any problems, questions or concerns.     Derek Jack, MD 12/01/18 2:29 PM

## 2018-12-02 ENCOUNTER — Ambulatory Visit: Payer: Self-pay | Admitting: Family Medicine

## 2018-12-02 LAB — PROTEIN ELECTROPHORESIS, SERUM
A/G Ratio: 1.2 (ref 0.7–1.7)
Albumin ELP: 3.6 g/dL (ref 2.9–4.4)
Alpha-1-Globulin: 0.3 g/dL (ref 0.0–0.4)
Alpha-2-Globulin: 0.7 g/dL (ref 0.4–1.0)
Beta Globulin: 1 g/dL (ref 0.7–1.3)
Gamma Globulin: 1.1 g/dL (ref 0.4–1.8)
Globulin, Total: 3.1 g/dL (ref 2.2–3.9)
Total Protein ELP: 6.7 g/dL (ref 6.0–8.5)

## 2018-12-02 NOTE — Telephone Encounter (Signed)
Pt was not happy with Rakes and meds she gave - she requested a change to [place] - her daughter in law sees him and has been pleased.

## 2018-12-02 NOTE — Addendum Note (Signed)
Addended by: Baruch Gouty on: 12/02/2018 10:26 AM   Modules accepted: Orders

## 2018-12-03 LAB — METHYLMALONIC ACID, SERUM: Methylmalonic Acid, Quantitative: 374 nmol/L (ref 0–378)

## 2018-12-03 LAB — COPPER, SERUM: Copper: 124 ug/dL (ref 72–166)

## 2018-12-04 ENCOUNTER — Other Ambulatory Visit: Payer: Self-pay | Admitting: Family Medicine

## 2018-12-04 DIAGNOSIS — F411 Generalized anxiety disorder: Secondary | ICD-10-CM

## 2018-12-29 ENCOUNTER — Ambulatory Visit (HOSPITAL_COMMUNITY): Payer: Self-pay | Admitting: Hematology

## 2019-01-15 ENCOUNTER — Ambulatory Visit: Payer: Self-pay | Admitting: Family Medicine

## 2019-01-21 ENCOUNTER — Encounter: Payer: Self-pay | Admitting: Family Medicine

## 2019-02-20 ENCOUNTER — Ambulatory Visit: Payer: Self-pay | Admitting: Family Medicine

## 2019-03-13 ENCOUNTER — Encounter: Payer: Self-pay | Admitting: *Deleted

## 2019-04-23 ENCOUNTER — Telehealth: Payer: Self-pay

## 2019-04-23 NOTE — Telephone Encounter (Signed)
error 

## 2019-06-03 ENCOUNTER — Encounter: Payer: Self-pay | Admitting: Family Medicine

## 2019-06-05 ENCOUNTER — Encounter: Payer: Self-pay | Admitting: Family Medicine

## 2019-06-05 ENCOUNTER — Ambulatory Visit (INDEPENDENT_AMBULATORY_CARE_PROVIDER_SITE_OTHER): Payer: Self-pay | Admitting: Family Medicine

## 2019-06-05 ENCOUNTER — Telehealth: Payer: Self-pay | Admitting: Family Medicine
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
## 2019-06-05 NOTE — Progress Notes (Signed)
Called every number listed in chart.  No answer and no correct number listed.

## 2020-05-14 HISTORY — PX: OTHER SURGICAL HISTORY: SHX169

## 2020-06-07 ENCOUNTER — Telehealth: Payer: Self-pay

## 2020-06-07 NOTE — Telephone Encounter (Signed)
Transition Care Management Unsuccessful Follow-up Telephone Call  Date of discharge and from where:  06/05/2020 from [facility]  Attempts:  1st Attempt  Reason for unsuccessful TCM follow-up call:  Unable to leave message

## 2020-06-08 NOTE — Telephone Encounter (Signed)
Transition Care Management Unsuccessful Follow-up Telephone Call  Date of discharge and from where:   06/05/2020 from [facility]  Attempts:  2nd Attempt  Reason for unsuccessful TCM follow-up call:  Unable to leave message

## 2020-06-09 NOTE — Telephone Encounter (Signed)
Transition Care Management Unsuccessful Follow-up Telephone Call  Date of discharge and from where:  06/05/2020 from [facility]  Attempts:  3rd Attempt  Reason for unsuccessful TCM follow-up call:  Unable to reach patient

## 2020-06-23 ENCOUNTER — Telehealth: Payer: Self-pay

## 2020-07-08 ENCOUNTER — Telehealth: Payer: Self-pay
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
## 2021-03-31 ENCOUNTER — Other Ambulatory Visit: Payer: Self-pay | Admitting: Physician Assistant

## 2021-03-31 ENCOUNTER — Ambulatory Visit
Admission: RE | Admit: 2021-03-31 | Discharge: 2021-03-31 | Disposition: A | Discharge status: Home / Self Care | Payer: Self-pay | Source: Ambulatory Visit | Attending: Physician Assistant | Admitting: Physician Assistant

## 2021-03-31 DIAGNOSIS — M79642 Pain in left hand: Secondary | ICD-10-CM

## 2021-03-31 DIAGNOSIS — M25512 Pain in left shoulder: Secondary | ICD-10-CM

## 2021-03-31 DIAGNOSIS — M25511 Pain in right shoulder: Secondary | ICD-10-CM

## 2021-03-31 DIAGNOSIS — M546 Pain in thoracic spine: Secondary | ICD-10-CM

## 2021-03-31 DIAGNOSIS — M542 Cervicalgia: Secondary | ICD-10-CM

## 2021-03-31 DIAGNOSIS — M79641 Pain in right hand: Secondary | ICD-10-CM

## 2021-04-03 DIAGNOSIS — G894 Chronic pain syndrome: Secondary | ICD-10-CM | POA: Diagnosis not present

## 2021-04-03 DIAGNOSIS — Z79899 Other long term (current) drug therapy: Secondary | ICD-10-CM | POA: Diagnosis not present

## 2021-04-03 DIAGNOSIS — M549 Dorsalgia, unspecified: Secondary | ICD-10-CM | POA: Diagnosis not present

## 2021-04-03 DIAGNOSIS — M25519 Pain in unspecified shoulder: Secondary | ICD-10-CM | POA: Diagnosis not present

## 2021-04-03 DIAGNOSIS — M542 Cervicalgia: Secondary | ICD-10-CM | POA: Diagnosis not present

## 2021-04-03 DIAGNOSIS — Z79891 Long term (current) use of opiate analgesic: Secondary | ICD-10-CM | POA: Diagnosis not present

## 2021-04-13 ENCOUNTER — Ambulatory Visit: Payer: Medicaid Other | Admitting: Orthopedic Surgery

## 2021-04-17 ENCOUNTER — Ambulatory Visit (INDEPENDENT_AMBULATORY_CARE_PROVIDER_SITE_OTHER): Payer: 59 | Admitting: Orthopedic Surgery

## 2021-04-17 ENCOUNTER — Encounter: Payer: Self-pay | Admitting: Orthopedic Surgery

## 2021-04-17 DIAGNOSIS — M65311 Trigger thumb, right thumb: Secondary | ICD-10-CM

## 2021-04-17 DIAGNOSIS — M65312 Trigger thumb, left thumb: Secondary | ICD-10-CM | POA: Diagnosis not present
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
## 2021-04-18 ENCOUNTER — Telehealth (HOSPITAL_COMMUNITY): Payer: Self-pay | Admitting: Physical Therapy

## 2021-04-18 NOTE — Telephone Encounter (Signed)
S/w patient and referring MD office staff -Ladona Ridgel. They agree it would be best to close this referral at this time and ask the surgeron to send a new referral after surgery. Referral from Dr. Sueanne Margarita will be closed.

## 2021-04-24 ENCOUNTER — Ambulatory Visit (HOSPITAL_COMMUNITY): Payer: 59 | Admitting: Physical Therapy

## 2021-04-25 ENCOUNTER — Telehealth: Payer: Self-pay | Admitting: Orthopedic Surgery

## 2021-04-25 NOTE — Telephone Encounter (Signed)
Pt called stating she was supposed to have a surgery scheduled with DR.Benfield but hadn't heard anything. She would like a CB from [place] even if it's just to tell her how much longer she may have to wait.   [phone]

## 2021-04-25 NOTE — Telephone Encounter (Signed)
Can you please advise on this

## 2021-04-25 NOTE — Telephone Encounter (Signed)
Err

## 2021-04-26 ENCOUNTER — Encounter (HOSPITAL_BASED_OUTPATIENT_CLINIC_OR_DEPARTMENT_OTHER): Payer: Self-pay | Admitting: Orthopedic Surgery

## 2021-04-26 ENCOUNTER — Encounter (HOSPITAL_COMMUNITY): Payer: 59 | Admitting: Physical Therapy

## 2021-04-26 ENCOUNTER — Other Ambulatory Visit: Payer: Self-pay

## 2021-05-01 ENCOUNTER — Encounter (HOSPITAL_COMMUNITY): Payer: 59 | Admitting: Physical Therapy

## 2021-05-02 DIAGNOSIS — M542 Cervicalgia: Secondary | ICD-10-CM | POA: Diagnosis not present

## 2021-05-02 DIAGNOSIS — M549 Dorsalgia, unspecified: Secondary | ICD-10-CM | POA: Diagnosis not present

## 2021-05-02 DIAGNOSIS — M25519 Pain in unspecified shoulder: Secondary | ICD-10-CM | POA: Diagnosis not present

## 2021-05-02 DIAGNOSIS — Z79891 Long term (current) use of opiate analgesic: Secondary | ICD-10-CM | POA: Diagnosis not present

## 2021-05-02 DIAGNOSIS — G894 Chronic pain syndrome: Secondary | ICD-10-CM | POA: Diagnosis not present

## 2021-05-02 DIAGNOSIS — M79643 Pain in unspecified hand: Secondary | ICD-10-CM | POA: Diagnosis not present

## 2021-05-02 DIAGNOSIS — Z79899 Other long term (current) drug therapy: Secondary | ICD-10-CM | POA: Diagnosis not present

## 2021-05-03 ENCOUNTER — Ambulatory Visit (HOSPITAL_BASED_OUTPATIENT_CLINIC_OR_DEPARTMENT_OTHER): Payer: 59 | Admitting: Anesthesiology

## 2021-05-03 ENCOUNTER — Other Ambulatory Visit: Payer: Self-pay

## 2021-05-03 ENCOUNTER — Encounter (HOSPITAL_COMMUNITY): Payer: 59 | Admitting: Physical Therapy

## 2021-05-03 ENCOUNTER — Encounter (HOSPITAL_BASED_OUTPATIENT_CLINIC_OR_DEPARTMENT_OTHER)
Admission: RE | Disposition: A | Discharge status: Home / Self Care | Payer: Self-pay | Source: Home / Self Care | Attending: Orthopedic Surgery

## 2021-05-03 ENCOUNTER — Telehealth: Payer: Self-pay | Admitting: Orthopedic Surgery

## 2021-05-03 ENCOUNTER — Other Ambulatory Visit: Payer: Self-pay | Admitting: Orthopedic Surgery

## 2021-05-03 ENCOUNTER — Encounter (HOSPITAL_BASED_OUTPATIENT_CLINIC_OR_DEPARTMENT_OTHER): Payer: Self-pay | Admitting: Orthopedic Surgery

## 2021-05-03 ENCOUNTER — Ambulatory Visit (HOSPITAL_BASED_OUTPATIENT_CLINIC_OR_DEPARTMENT_OTHER)
Admission: RE | Admit: 2021-05-03 | Discharge: 2021-05-03 | Disposition: A | Discharge status: Home / Self Care | Payer: 59 | Attending: Orthopedic Surgery | Admitting: Orthopedic Surgery

## 2021-05-03 DIAGNOSIS — F32A Depression, unspecified: Secondary | ICD-10-CM | POA: Diagnosis not present

## 2021-05-03 DIAGNOSIS — R69 Illness, unspecified: Secondary | ICD-10-CM | POA: Diagnosis not present

## 2021-05-03 DIAGNOSIS — M1990 Unspecified osteoarthritis, unspecified site: Secondary | ICD-10-CM | POA: Diagnosis not present

## 2021-05-03 DIAGNOSIS — M65312 Trigger thumb, left thumb: Secondary | ICD-10-CM | POA: Diagnosis not present

## 2021-05-03 DIAGNOSIS — R519 Headache, unspecified: Secondary | ICD-10-CM | POA: Insufficient documentation

## 2021-05-03 DIAGNOSIS — M65311 Trigger thumb, right thumb: Secondary | ICD-10-CM | POA: Diagnosis not present

## 2021-05-03 DIAGNOSIS — F419 Anxiety disorder, unspecified: Secondary | ICD-10-CM | POA: Diagnosis not present

## 2021-05-03 DIAGNOSIS — F17210 Nicotine dependence, cigarettes, uncomplicated: Secondary | ICD-10-CM | POA: Insufficient documentation

## 2021-05-03 HISTORY — DX: Other cervical disc degeneration, unspecified cervical region: M50.30

## 2021-05-03 HISTORY — PX: TRIGGER FINGER RELEASE: SHX641

## 2021-05-03 LAB — POCT PREGNANCY, URINE: Preg Test, Ur: NEGATIVE

## 2021-05-03 SURGERY — RELEASE, A1 PULLEY, FOR TRIGGER FINGER
Anesthesia: Monitor Anesthesia Care | Site: Finger | Laterality: Left

## 2021-05-03 MED ORDER — LIDOCAINE HCL (PF) 1 % IJ SOLN
INTRAMUSCULAR | Status: DC | PRN
  Administered 2021-05-03: 3 mL

## 2021-05-03 MED ORDER — LACTATED RINGERS IV SOLN: INTRAVENOUS | Status: DC

## 2021-05-03 MED ORDER — HYDROMORPHONE HCL 1 MG/ML IJ SOLN: 0.2500 mg | INTRAMUSCULAR | Status: DC | PRN

## 2021-05-03 MED ORDER — PROPOFOL 10 MG/ML IV BOLUS
INTRAVENOUS | Status: AC
  Filled 2021-05-03: qty 20

## 2021-05-03 MED ORDER — PROPOFOL 500 MG/50ML IV EMUL
INTRAVENOUS | Status: DC | PRN
  Administered 2021-05-03: 100 ug/kg/min via INTRAVENOUS

## 2021-05-03 MED ORDER — PROMETHAZINE HCL 25 MG/ML IJ SOLN: 6.2500 mg | INTRAMUSCULAR | Status: DC | PRN

## 2021-05-03 MED ORDER — FENTANYL CITRATE (PF) 100 MCG/2ML IJ SOLN
INTRAMUSCULAR | Status: DC | PRN
  Administered 2021-05-03: 100 ug via INTRAVENOUS

## 2021-05-03 MED ORDER — OXYCODONE HCL 5 MG/5ML PO SOLN: 5.0000 mg | Freq: Once | ORAL | Status: DC | PRN

## 2021-05-03 MED ORDER — MIDAZOLAM HCL 2 MG/2ML IJ SOLN
INTRAMUSCULAR | Status: AC
  Filled 2021-05-03: qty 2

## 2021-05-03 MED ORDER — LIDOCAINE HCL (PF) 1 % IJ SOLN
INTRAMUSCULAR | Status: AC
  Filled 2021-05-03: qty 30

## 2021-05-03 MED ORDER — MIDAZOLAM HCL 5 MG/5ML IJ SOLN
INTRAMUSCULAR | Status: DC | PRN
  Administered 2021-05-03 (×2): 1 mg via INTRAVENOUS

## 2021-05-03 MED ORDER — OXYCODONE HCL 5 MG PO TABS: 5.0000 mg | ORAL_TABLET | Freq: Four times a day (QID) | ORAL | 0 refills | Status: AC | PRN

## 2021-05-03 MED ORDER — 0.9 % SODIUM CHLORIDE (POUR BTL) OPTIME
TOPICAL | Status: DC | PRN
  Administered 2021-05-03: 09:00:00 50 mL

## 2021-05-03 MED ORDER — ONDANSETRON HCL 4 MG/2ML IJ SOLN
INTRAMUSCULAR | Status: DC | PRN
  Administered 2021-05-03: 4 mg via INTRAVENOUS

## 2021-05-03 MED ORDER — PROPOFOL 10 MG/ML IV BOLUS
INTRAVENOUS | Status: DC | PRN
  Administered 2021-05-03: 40 mg via INTRAVENOUS

## 2021-05-03 MED ORDER — OXYCODONE HCL 5 MG PO TABS: 5.0000 mg | ORAL_TABLET | Freq: Once | ORAL | Status: DC | PRN

## 2021-05-03 MED ORDER — FENTANYL CITRATE (PF) 100 MCG/2ML IJ SOLN
INTRAMUSCULAR | Status: AC
  Filled 2021-05-03: qty 2

## 2021-05-03 MED ORDER — BUPIVACAINE HCL (PF) 0.25 % IJ SOLN
INTRAMUSCULAR | Status: AC
  Filled 2021-05-03: qty 30

## 2021-05-03 SURGICAL SUPPLY — 41 items
APL PRP STRL LF DISP 70% ISPRP (MISCELLANEOUS) ×1
BLADE SURG 15 STRL LF DISP TIS (BLADE) ×1 IMPLANT
BLADE SURG 15 STRL SS (BLADE) ×3
BNDG CMPR 9X4 STRL LF SNTH (GAUZE/BANDAGES/DRESSINGS) ×1
BNDG ELASTIC 3X5.8 VLCR STR LF (GAUZE/BANDAGES/DRESSINGS) ×3 IMPLANT
BNDG ESMARK 4X9 LF (GAUZE/BANDAGES/DRESSINGS) ×3 IMPLANT
BNDG GAUZE ELAST 4 BULKY (GAUZE/BANDAGES/DRESSINGS) IMPLANT
CHLORAPREP W/TINT 26 (MISCELLANEOUS) ×3 IMPLANT
CORD BIPOLAR FORCEPS 12FT (ELECTRODE) ×3 IMPLANT
COVER BACK TABLE 60X90IN (DRAPES) ×3 IMPLANT
COVER MAYO STAND STRL (DRAPES) ×3 IMPLANT
CUFF TOURN SGL QUICK 18X4 (TOURNIQUET CUFF) IMPLANT
CUFF TOURN SGL QUICK 24 (TOURNIQUET CUFF)
CUFF TRNQT CYL 24X4X16.5-23 (TOURNIQUET CUFF) IMPLANT
DRAPE EXTREMITY T 121X128X90 (DISPOSABLE) ×3 IMPLANT
DRAPE SURG 17X23 STRL (DRAPES) ×3 IMPLANT
GAUZE SPONGE 4X4 12PLY STRL (GAUZE/BANDAGES/DRESSINGS) IMPLANT
GAUZE XEROFORM 1X8 LF (GAUZE/BANDAGES/DRESSINGS) ×3 IMPLANT
GLOVE SURG ENC MOIS LTX SZ7 (GLOVE) ×3 IMPLANT
GLOVE SURG UNDER POLY LF SZ7 (GLOVE) ×3 IMPLANT
GOWN STRL REUS W/ TWL LRG LVL3 (GOWN DISPOSABLE) ×1 IMPLANT
GOWN STRL REUS W/TWL LRG LVL3 (GOWN DISPOSABLE) ×3
GOWN STRL REUS W/TWL XL LVL3 (GOWN DISPOSABLE) ×3 IMPLANT
NDL HYPO 25X1 1.5 SAFETY (NEEDLE) ×1 IMPLANT
NEEDLE HYPO 25X1 1.5 SAFETY (NEEDLE) ×3 IMPLANT
NS IRRIG 1000ML POUR BTL (IV SOLUTION) ×3 IMPLANT
PACK BASIN DAY SURGERY FS (CUSTOM PROCEDURE TRAY) ×3 IMPLANT
PAD CAST 3X4 CTTN HI CHSV (CAST SUPPLIES) IMPLANT
PADDING CAST COTTON 3X4 STRL (CAST SUPPLIES)
SLEEVE SCD COMPRESS KNEE MED (STOCKING) IMPLANT
SUCTION FRAZIER HANDLE 10FR (MISCELLANEOUS)
SUCTION TUBE FRAZIER 10FR DISP (MISCELLANEOUS) IMPLANT
SUT ETHILON 4 0 PS 2 18 (SUTURE) ×3 IMPLANT
SUT MNCRL AB 3-0 PS2 18 (SUTURE) IMPLANT
SUT VICRYL 4-0 PS2 18IN ABS (SUTURE) IMPLANT
SYR BULB EAR ULCER 3OZ GRN STR (SYRINGE) ×3 IMPLANT
SYR CONTROL 10ML LL (SYRINGE) ×3 IMPLANT
TOWEL GREEN STERILE FF (TOWEL DISPOSABLE) ×6 IMPLANT
TUBE CONNECTING 20'X1/4 (TUBING)
TUBE CONNECTING 20X1/4 (TUBING) IMPLANT
UNDERPAD 30X36 HEAVY ABSORB (UNDERPADS AND DIAPERS) ×3 IMPLANT

## 2021-05-03 NOTE — Anesthesia Preprocedure Evaluation (Signed)
Anesthesia Evaluation  Patient identified by MRN, date of birth, ID band Patient awake    Reviewed: Allergy & Precautions, NPO status , Patient's Chart, lab work & pertinent test results  Airway Mallampati: II  TM Distance: >3 FB Neck ROM: Full    Dental no notable dental hx.    Pulmonary neg pulmonary ROS, Current Smoker and Patient abstained from smoking.,    Pulmonary exam normal breath sounds clear to auscultation       Cardiovascular negative cardio ROS Normal cardiovascular exam Rhythm:Regular Rate:Normal     Neuro/Psych  Headaches, Anxiety Depression negative psych ROS   GI/Hepatic negative GI ROS, Neg liver ROS,   Endo/Other  negative endocrine ROS  Renal/GU negative Renal ROS  negative genitourinary   Musculoskeletal  (+) Arthritis , Osteoarthritis,    Abdominal   Peds negative pediatric ROS (+)  Hematology negative hematology ROS (+)   Anesthesia Other Findings   Reproductive/Obstetrics negative OB ROS                             Anesthesia Physical Anesthesia Plan  ASA: 2  Anesthesia Plan: MAC   Post-op Pain Management:    Induction: Intravenous  PONV Risk Score and Plan: 1 and Ondansetron and Treatment may vary due to age or medical condition  Airway Management Planned: Simple Face Mask  Additional Equipment:   Intra-op Plan:   Post-operative Plan:   Informed Consent: I have reviewed the patients History and Physical, chart, labs and discussed the procedure including the risks, benefits and alternatives for the proposed anesthesia with the patient or authorized representative who has indicated his/her understanding and acceptance.     Dental advisory given  Plan Discussed with: CRNA  Anesthesia Plan Comments:         Anesthesia Quick Evaluation

## 2021-05-03 NOTE — Telephone Encounter (Signed)
Patient's daughter in law Jamie Henderson called asked if the pain medicine was sent to [facility] in [place]   [address]  ph# [phone]  Patient also want to know what is the name of the pain medication.    The number to contact patient [phone]

## 2021-05-03 NOTE — Discharge Instructions (Addendum)
 Charles Benfield, M.D. Hand Surgery  POST-OPERATIVE DISCHARGE INSTRUCTIONS   PRESCRIPTIONS: - You have been given a prescription to be taken as directed for post-operative pain control.  You may also take over the counter ibuprofen/aleve and tylenol for pain. Take this as directed on the packaging. Do not exceed 3000 mg tylenol/acetaminophen in 24 hours.  Ibuprofen 600-800 mg (3-4) tablets by mouth every 6 hours as needed for pain.   OR  Aleve 2 tablets by mouth every 12 hours (twice daily) as needed for pain.   AND/OR  Tylenol 1000 mg (2 tablets) every 8 hours as needed for pain.  - Please use your pain medication carefully, as refills are limited and you may not be provided with one.  As stated above, please use over the counter pain medicine - it will also be helpful with decreasing your swelling.    ANESTHESIA: -After your surgery, post-surgical discomfort or pain is likely. This discomfort can last several days to a few weeks. At certain times of the [place] your discomfort may be more intense.   Did you receive a nerve block?   - A nerve block can provide pain relief for one hour to two days after your surgery. As long as the nerve block is working, you will experience little or no sensation in the area the surgeon operated on.  - As the nerve block wears off, you will begin to experience pain or discomfort. It is very important that you begin taking your prescribed pain medication before the nerve block fully wears off. Treating your pain at the first sign of the block wearing off will ensure your pain is better controlled and more tolerable when full-sensation returns. Do not wait until the pain is intolerable, as the medicine will be less effective. It is better to treat pain in advance than to try and catch up.   General Anesthesia:  If you did not receive a nerve block during your surgery, you will need to start taking your pain medication shortly after your surgery and  should continue to do so as prescribed by your surgeon.     ICE AND ELEVATION: - You may use ice for the first 48-72 hours, but it is not critical.   - Motion of your fingers is very important to decrease the swelling.  - Elevation, as much as possible for the next 48 hours, is critical for decreasing swelling as well as for pain relief. Elevation means when you are seated or lying down, you hand should be at or above your heart. When walking, the hand needs to be at or above the level of your elbow.  - If the bandage gets too tight, it may need to be loosened. Please contact our office and we will instruct you in how to do this.    SURGICAL BANDAGES:  - Keep your dressing and/or splint clean and dry at all times.  You can remove your dressing 4 days from now and change with a dry dressing or Band-Aids as needed thereafter. - You may place a plastic bag over your bandage to shower, but be careful, do not get your bandages wet.  - After the bandages have been removed, it is OK to get the stitches wet in a shower or with hand washing. Do Not soak or submerge the wound yet. Please do not use lotions or creams on the stitches.      HAND THERAPY:  - You may not need any. If you do,   we will begin this at your follow up visit in the clinic.    ACTIVITY AND WORK: - You are encouraged to move any fingers which are not in the bandage.  - Light use of the fingers is allowed to assist the other hand with daily hygiene and eating, but strong gripping or lifting is often uncomfortable and should be avoided.  - You might miss a variable period of time from work and hopefully this issue has been discussed prior to surgery. You may not do any heavy work with your affected hand for about 2 weeks.    [facility] [address] [place],  [place]  [address] [phone]   Post Anesthesia Home Care Instructions  Activity: Get plenty of rest for the remainder of the [place]. A responsible  individual must stay with you for 24 hours following the procedure.  For the next 24 hours, DO NOT: -Drive a car -Operate machinery -Drink alcoholic beverages -Take any medication unless instructed by your physician -Make any legal decisions or sign important papers.  Meals: Start with liquid foods such as gelatin or soup. Progress to regular foods as tolerated. Avoid greasy, spicy, heavy foods. If nausea and/or vomiting occur, drink only clear liquids until the nausea and/or vomiting subsides. Call your physician if vomiting continues.  Special Instructions/Symptoms: Your throat may feel dry or sore from the anesthesia or the breathing tube placed in your throat during surgery. If this causes discomfort, gargle with warm salt water. The discomfort should disappear within 24 hours.  If you had a scopolamine patch placed behind your ear for the management of post- operative nausea and/or vomiting:  1. The medication in the patch is effective for 72 hours, after which it should be removed.  Wrap patch in a tissue and discard in the trash. Wash hands thoroughly with soap and water. 2. You may remove the patch earlier than 72 hours if you experience unpleasant side effects which may include dry mouth, dizziness or visual disturbances. 3. Avoid touching the patch. Wash your hands with soap and water after contact with the patch.     

## 2021-05-03 NOTE — Transfer of Care (Signed)
Immediate Anesthesia Transfer of Care Note  Patient: Jamie Henderson  Procedure(s) Performed: Left Thumb RELEASE TRIGGER FINGER/A-1 PULLEY (Left: Finger)  Patient Location: PACU  Anesthesia Type:MAC  Level of Consciousness: awake, alert  and oriented  Airway & Oxygen Therapy: Patient Spontanous Breathing and Patient connected to face mask oxygen  Post-op Assessment: Report given to RN and Post -op Vital signs reviewed and stable  Post vital signs: Reviewed and stable  Last Vitals:  Vitals Value Taken Time  BP    Temp    Pulse 77 05/03/21 0904  Resp    SpO2 96 % 05/03/21 0904  Vitals shown include unvalidated device data.  Last Pain:  Vitals:   05/03/21 0721  TempSrc: Oral  PainSc: 2       Patients Stated Pain Goal: 10 (22/02/54 2706)  Complications: No notable events documented.

## 2021-05-03 NOTE — Brief Op Note (Signed)
05/03/2021  9:02 AM  PATIENT:  Jamie Henderson  45 y.o. female  PRE-OPERATIVE DIAGNOSIS:  Left Trigger Thumb  POST-OPERATIVE DIAGNOSIS:  Left Trigger Thumb  PROCEDURE:  Procedure(s): Left Thumb RELEASE TRIGGER FINGER/A-1 PULLEY (Left)  SURGEON:  Surgeon(s) and Role:    * Sherilyn Cooter, MD - Primary  PHYSICIAN ASSISTANT:   ASSISTANTS: none   ANESTHESIA:   local and MAC  EBL:  < 5cc   BLOOD ADMINISTERED:none  DRAINS: none   LOCAL MEDICATIONS USED:  LIDOCAINE   SPECIMEN:  No Specimen  DISPOSITION OF SPECIMEN:  N/A  COUNTS:  YES  TOURNIQUET:   Total Tourniquet Time Documented: Forearm (Left) - 6 minutes Total: Forearm (Left) - 6 minutes   DICTATION: .Dragon Dictation  PLAN OF CARE: Discharge to home after PACU  PATIENT DISPOSITION:  PACU - hemodynamically stable.   Delay start of Pharmacological VTE agent (>24hrs) due to surgical blood loss or risk of bleeding: not applicable

## 2021-05-03 NOTE — Interval H&P Note (Signed)
History and Physical Interval Note:  05/03/2021 8:17 AM  Jamie Henderson  has presented today for surgery, with the diagnosis of Left Trigger Thumb.  The various methods of treatment have been discussed with the patient and family. After consideration of risks, benefits and other options for treatment, the patient has consented to  Procedure(s): Left Thumb RELEASE TRIGGER FINGER/A-1 PULLEY (Left) as a surgical intervention.  The patient's history has been reviewed, patient examined, no change in status, stable for surgery.  I have reviewed the patient's chart and labs.  Questions were answered to the patient's satisfaction.      Lateasha Breuer

## 2021-05-03 NOTE — Op Note (Signed)
° °  Date of Surgery: 05/03/2021  INDICATIONS: Jamie Henderson is a 45 y.o.-year-old female with left trigger thumb that has failed conservative management.  Risks, benefits, and alternatives to surgery were again discussed with the patient wishing to proceed with surgery.  Informed consent was signed after our discussion.   PREOPERATIVE DIAGNOSIS: 1. Left trigger thumb   POSTOPERATIVE DIAGNOSIS: Same.  PROCEDURE: 1. Left trigger thumb A1 pulley release   SURGEON: Audria Nine, M.D.  ASSIST:   ANESTHESIA:  Local, MAC  IV FLUIDS AND URINE: See anesthesia.  ESTIMATED BLOOD LOSS: <5  mL.  IMPLANTS: * No implants in log *   DRAINS: None  COMPLICATIONS: see description of procedure.  DESCRIPTION OF PROCEDURE: The patient was met in the preoperative holding area where the surgical site was marked and the consent form was verified.  The patient was then taken to the operating room and transferred to the operating table.  All bony prominences were well padded.  A tourniquet was applied to the left forearm.  Following a surgical timeout, a local block was performed using 1% plain lidocaine. The operative extremity was prepped and draped in the usual and sterile fashion.  A formal time-out was again performed to confirm that this was the correct patient, surgery, side, and site.   Following timeout, the limb was exsanguinated and the tourniquet inflated to 250 mmHg.  A transverse incision was made over the A1 pulley taking care to incise the skin only.  Blunt dissection was used to identify the neighboring digital nerves.  These were protected throughout the procedure.  The A1 pulley was identified and sharply incised.  Complete release was performed under direct visualization with tenotomy scissors.  Follow complete A1 pulley release, the patient was reversed from sedation and demonstrated full IP joint active range of motion without locking or triggering.  The wound was then thoroughly irrigated and  closed with 4-0 nylon sutures in a horizontal mattress fashion.  The wound was dressed with xeroform, 4x4, and an ACE wrap.   All counts were correct at the end of the procedure.  The patient was then transferred to the postoperative bed and taken to the PACU in stable condition.    POSTOPERATIVE PLAN: Patient will be discharged to home with appropriate pain medication and discharge instructions.  I will see Jamie Henderson back in 10-14 days for Jamie Henderson first postop visit.   Audria Nine, MD 9:03 AM

## 2021-05-03 NOTE — Anesthesia Postprocedure Evaluation (Signed)
Anesthesia Post Note  Patient: Jamie Henderson  Procedure(s) Performed: Left Thumb RELEASE TRIGGER FINGER/A-1 PULLEY (Left: Finger)     Patient location during evaluation: PACU Anesthesia Type: MAC Level of consciousness: awake and alert Pain management: pain level controlled Vital Signs Assessment: post-procedure vital signs reviewed and stable Respiratory status: spontaneous breathing, nonlabored ventilation and respiratory function stable Cardiovascular status: blood pressure returned to baseline and stable Postop Assessment: no apparent nausea or vomiting Anesthetic complications: no   No notable events documented.  Last Vitals:  Vitals:   05/03/21 0915 05/03/21 0929  BP: (!) 150/90 130/82  Pulse: 78 80  Resp: 15   Temp:  36.5 C  SpO2: 99% 96%    Last Pain:  Vitals:   05/03/21 0929  TempSrc:   PainSc: 0-No pain                 Lynda Rainwater

## 2021-05-03 NOTE — Telephone Encounter (Signed)
I called and advised that her meds were sent in to her pharmacy

## 2021-05-04 ENCOUNTER — Encounter (HOSPITAL_BASED_OUTPATIENT_CLINIC_OR_DEPARTMENT_OTHER): Payer: Self-pay | Admitting: Orthopedic Surgery

## 2021-05-04 NOTE — Progress Notes (Signed)
Left message stating courtesy call and if any questions or concerns please call the doctors office.  

## 2021-05-09 ENCOUNTER — Encounter (HOSPITAL_COMMUNITY): Payer: 59 | Admitting: Physical Therapy

## 2021-05-11 ENCOUNTER — Encounter (HOSPITAL_COMMUNITY): Payer: 59 | Admitting: Physical Therapy

## 2021-05-19 ENCOUNTER — Ambulatory Visit (INDEPENDENT_AMBULATORY_CARE_PROVIDER_SITE_OTHER): Payer: 59 | Admitting: Orthopedic Surgery

## 2021-05-19 ENCOUNTER — Other Ambulatory Visit: Payer: Self-pay

## 2021-05-19 DIAGNOSIS — M65312 Trigger thumb, left thumb: Secondary | ICD-10-CM
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
## 2021-05-22 ENCOUNTER — Encounter (HOSPITAL_BASED_OUTPATIENT_CLINIC_OR_DEPARTMENT_OTHER): Payer: Self-pay | Admitting: Orthopedic Surgery

## 2021-05-22 ENCOUNTER — Other Ambulatory Visit: Payer: Self-pay

## 2021-05-29 ENCOUNTER — Encounter (HOSPITAL_BASED_OUTPATIENT_CLINIC_OR_DEPARTMENT_OTHER)
Admission: RE | Disposition: A | Discharge status: Home / Self Care | Payer: Self-pay | Source: Home / Self Care | Attending: Orthopedic Surgery

## 2021-05-29 ENCOUNTER — Encounter (HOSPITAL_COMMUNITY): Payer: Self-pay | Admitting: Anesthesiology

## 2021-05-29 ENCOUNTER — Ambulatory Visit (HOSPITAL_BASED_OUTPATIENT_CLINIC_OR_DEPARTMENT_OTHER)
Admission: RE | Admit: 2021-05-29 | Discharge: 2021-05-29 | Disposition: A | Discharge status: Home / Self Care | Payer: 59 | Attending: Orthopedic Surgery | Admitting: Orthopedic Surgery

## 2021-05-29 SURGERY — RELEASE, A1 PULLEY, FOR TRIGGER FINGER
Anesthesia: Choice | Laterality: Right

## 2021-05-29 NOTE — Anesthesia Preprocedure Evaluation (Deleted)
Anesthesia Evaluation    Reviewed: Allergy & Precautions, Patient's Chart, lab work & pertinent test results  Airway        Dental   Pulmonary Current Smoker,           Cardiovascular negative cardio ROS       Neuro/Psych  Headaches, PSYCHIATRIC DISORDERS Anxiety Depression    GI/Hepatic negative GI ROS, Neg liver ROS,   Endo/Other    Renal/GU negative Renal ROS     Musculoskeletal  (+) Arthritis ,   Abdominal   Peds  Hematology negative hematology ROS (+)   Anesthesia Other Findings   Reproductive/Obstetrics                             Anesthesia Physical Anesthesia Plan  ASA: 2  Anesthesia Plan: MAC   Post-op Pain Management:    Induction:   PONV Risk Score and Plan: 3 and Treatment may vary due to age or medical condition, Midazolam and Ondansetron  Airway Management Planned: Natural Airway and Simple Face Mask  Additional Equipment: None  Intra-op Plan:   Post-operative Plan:   Informed Consent:     Dental advisory given  Plan Discussed with:   Anesthesia Plan Comments:         Anesthesia Quick Evaluation

## 2021-06-08 ENCOUNTER — Encounter: Payer: 59 | Admitting: Orthopedic Surgery

## 2021-07-04 ENCOUNTER — Other Ambulatory Visit: Payer: Self-pay

## 2021-07-04 ENCOUNTER — Encounter (HOSPITAL_BASED_OUTPATIENT_CLINIC_OR_DEPARTMENT_OTHER): Payer: Self-pay | Admitting: Orthopedic Surgery

## 2021-07-11 ENCOUNTER — Encounter (HOSPITAL_BASED_OUTPATIENT_CLINIC_OR_DEPARTMENT_OTHER)
Admission: RE | Admit: 2021-07-11 | Discharge: 2021-07-11 | Disposition: A | Discharge status: Home / Self Care | Payer: Medicaid Other | Source: Ambulatory Visit | Attending: Orthopedic Surgery | Admitting: Orthopedic Surgery

## 2021-07-11 ENCOUNTER — Encounter (HOSPITAL_COMMUNITY): Payer: Self-pay | Admitting: Anesthesiology

## 2021-07-11 DIAGNOSIS — Z01818 Encounter for other preprocedural examination: Secondary | ICD-10-CM | POA: Insufficient documentation

## 2021-07-11 DIAGNOSIS — Z79899 Other long term (current) drug therapy: Secondary | ICD-10-CM | POA: Insufficient documentation

## 2021-07-11 LAB — BASIC METABOLIC PANEL
Anion gap: 7 (ref 5–15)
BUN: 11 mg/dL (ref 6–20)
CO2: 32 mmol/L (ref 22–32)
Calcium: 9.5 mg/dL (ref 8.9–10.3)
Chloride: 96 mmol/L — ABNORMAL LOW (ref 98–111)
Creatinine, Ser: 0.62 mg/dL (ref 0.44–1.00)
GFR, Estimated: >60 mL/min (ref >60)
Glucose, Bld: 101 mg/dL — ABNORMAL HIGH (ref 70–99)
Potassium: 3.2 mmol/L — ABNORMAL LOW (ref 3.5–5.1)
Sodium: 135 mmol/L (ref 135–145)

## 2021-07-11 NOTE — Progress Notes (Signed)
° ° ° ° ° °  Patient Instructions ° °The night before surgery:  °No food after midnight. ONLY clear liquids after midnight ° °The [place] of surgery (if you do NOT have diabetes):  °Drink ONE (1) Pre-Surgery Clear Ensure as directed.   °This drink was given to you during your hospital  °pre-op appointment visit. °The pre-op nurse will instruct you on the time to drink the  °Pre-Surgery Ensure depending on your surgery time. °Finish the drink at the designated time by the pre-op nurse.  °Nothing else to drink after completing the  °Pre-Surgery Clear Ensure. ° °The [place] of surgery (if you have diabetes): °Drink ONE (1) Gatorade 2 (G2) as directed. °This drink was given to you during your hospital  °pre-op appointment visit.  °The pre-op nurse will instruct you on the time to drink the  ° Gatorade 2 (G2) depending on your surgery time. °Color of the Gatorade may vary. Red is not allowed. °Nothing else to drink after completing the  °Gatorade 2 (G2). ° °       If you have questions, please contact your surgeon’s office.  °

## 2021-07-11 NOTE — Anesthesia Preprocedure Evaluation (Deleted)
Anesthesia Evaluation    Airway        Dental   Pulmonary Current Smoker,           Cardiovascular hypertension, Pt. on medications      Neuro/Psych  Headaches, PSYCHIATRIC DISORDERS Anxiety Depression    GI/Hepatic   Endo/Other    Renal/GU      Musculoskeletal  (+) Arthritis , Osteoarthritis,  Right trigger thumb   Abdominal   Peds  Hematology  (+) Blood dyscrasia, anemia ,   Anesthesia Other Findings   Reproductive/Obstetrics                            Anesthesia Physical Anesthesia Plan  ASA: 2  Anesthesia Plan: MAC   Post-op Pain Management: Minimal or no pain anticipated   Induction: Intravenous  PONV Risk Score and Plan: 2 and Treatment may vary due to age or medical condition and Propofol infusion  Airway Management Planned: Natural Airway, Nasal Cannula and Simple Face Mask  Additional Equipment:   Intra-op Plan:   Post-operative Plan:   Informed Consent:   Plan Discussed with:   Anesthesia Plan Comments:         Anesthesia Quick Evaluation

## 2021-07-12 ENCOUNTER — Ambulatory Visit (HOSPITAL_BASED_OUTPATIENT_CLINIC_OR_DEPARTMENT_OTHER): Admission: RE | Admit: 2021-07-12 | Payer: 59 | Source: Home / Self Care | Admitting: Orthopedic Surgery

## 2021-07-12 DIAGNOSIS — Z79899 Other long term (current) drug therapy: Secondary | ICD-10-CM

## 2021-07-12 HISTORY — DX: Unspecified dementia, unspecified severity, without behavioral disturbance, psychotic disturbance, mood disturbance, and anxiety: F03.90

## 2021-07-12 HISTORY — DX: Essential (primary) hypertension: I10

## 2021-07-12 SURGERY — RELEASE, A1 PULLEY, FOR TRIGGER FINGER
Anesthesia: Choice | Site: Thumb | Laterality: Right

## 2021-07-12 MED ORDER — LIDOCAINE 2% (20 MG/ML) 5 ML SYRINGE
INTRAMUSCULAR | Status: AC
  Filled 2021-07-12: qty 5

## 2021-07-12 MED ORDER — BUPIVACAINE HCL (PF) 0.25 % IJ SOLN
INTRAMUSCULAR | Status: AC
  Filled 2021-07-12: qty 30

## 2021-07-12 MED ORDER — FENTANYL CITRATE (PF) 100 MCG/2ML IJ SOLN
INTRAMUSCULAR | Status: AC
Start: 2021-07-12 — End: ?
  Filled 2021-07-12: qty 2

## 2021-07-12 MED ORDER — LIDOCAINE-EPINEPHRINE 1 %-1:100000 IJ SOLN
INTRAMUSCULAR | Status: AC
  Filled 2021-07-12: qty 1

## 2021-07-12 MED ORDER — MIDAZOLAM HCL 2 MG/2ML IJ SOLN
INTRAMUSCULAR | Status: AC
  Filled 2021-07-12: qty 2

## 2021-07-12 MED ORDER — CEFAZOLIN SODIUM 1 G IJ SOLR
INTRAMUSCULAR | Status: AC
  Filled 2021-07-12: qty 20

## 2021-07-12 MED ORDER — LIDOCAINE HCL (PF) 1 % IJ SOLN
INTRAMUSCULAR | Status: AC
  Filled 2021-07-12: qty 30

## 2021-07-12 MED ORDER — PROPOFOL 500 MG/50ML IV EMUL
INTRAVENOUS | Status: AC
  Filled 2021-07-12: qty 50

## 2021-07-12 MED ORDER — BUPIVACAINE-EPINEPHRINE (PF) 0.25% -1:200000 IJ SOLN
INTRAMUSCULAR | Status: AC
  Filled 2021-07-12: qty 30

## 2021-07-12 MED ORDER — PROPOFOL 10 MG/ML IV BOLUS
INTRAVENOUS | Status: AC
  Filled 2021-07-12: qty 20

## 2021-07-24 ENCOUNTER — Encounter: Payer: 59 | Admitting: Orthopedic Surgery

## 2021-08-27 ENCOUNTER — Encounter (HOSPITAL_COMMUNITY): Payer: Self-pay

## 2021-08-27 ENCOUNTER — Emergency Department (HOSPITAL_COMMUNITY)
Admission: EM | Admit: 2021-08-27 | Discharge: 2021-08-27 | Discharge status: Against Medical Advice | Payer: 59 | Attending: Emergency Medicine | Admitting: Emergency Medicine

## 2021-08-27 ENCOUNTER — Other Ambulatory Visit: Payer: Self-pay

## 2021-08-27 DIAGNOSIS — K047 Periapical abscess without sinus: Secondary | ICD-10-CM | POA: Diagnosis not present

## 2021-08-27 DIAGNOSIS — Z5321 Procedure and treatment not carried out due to patient leaving prior to being seen by health care provider: Secondary | ICD-10-CM | POA: Diagnosis not present

## 2021-08-27 DIAGNOSIS — M7918 Myalgia, other site: Secondary | ICD-10-CM | POA: Insufficient documentation

## 2021-08-27 DIAGNOSIS — R519 Headache, unspecified: Secondary | ICD-10-CM | POA: Insufficient documentation

## 2021-08-27 NOTE — ED Notes (Signed)
Pt stated that she was tired of waiting and was leaving to go to another hospital. Pt was advised to stay that MD should be able to get to her soon. Pt stated she was done waiting.  ?

## 2021-08-27 NOTE — ED Triage Notes (Signed)
Pt arrived from home via POV w c/o multiple dental abscesses stating that she noticed a black spot on her tongue this morning and that worried her. She also c/o generalized body aches and headache.  ?

## 2022-09-05 DIAGNOSIS — M9903 Segmental and somatic dysfunction of lumbar region: Secondary | ICD-10-CM | POA: Diagnosis not present

## 2022-09-05 DIAGNOSIS — M9901 Segmental and somatic dysfunction of cervical region: Secondary | ICD-10-CM | POA: Diagnosis not present

## 2022-09-05 DIAGNOSIS — M62830 Muscle spasm of back: Secondary | ICD-10-CM | POA: Diagnosis not present

## 2022-09-05 DIAGNOSIS — M9902 Segmental and somatic dysfunction of thoracic region: Secondary | ICD-10-CM | POA: Diagnosis not present

## 2023-02-12 DIAGNOSIS — Z7689 Persons encountering health services in other specified circumstances: Secondary | ICD-10-CM | POA: Diagnosis not present

## 2023-02-12 DIAGNOSIS — I10 Essential (primary) hypertension: Secondary | ICD-10-CM | POA: Diagnosis not present

## 2023-02-12 DIAGNOSIS — F411 Generalized anxiety disorder: Secondary | ICD-10-CM | POA: Diagnosis not present
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
## 2023-10-09 IMAGING — CR DG HAND COMPLETE 3+V*R*
3 series · 3 of 3 positions shown · non-contrast
Comparison: None.

CLINICAL DATA: Hand pain.

EXAM:
RIGHT HAND - COMPLETE 3+ VIEW

[x hand pa right]
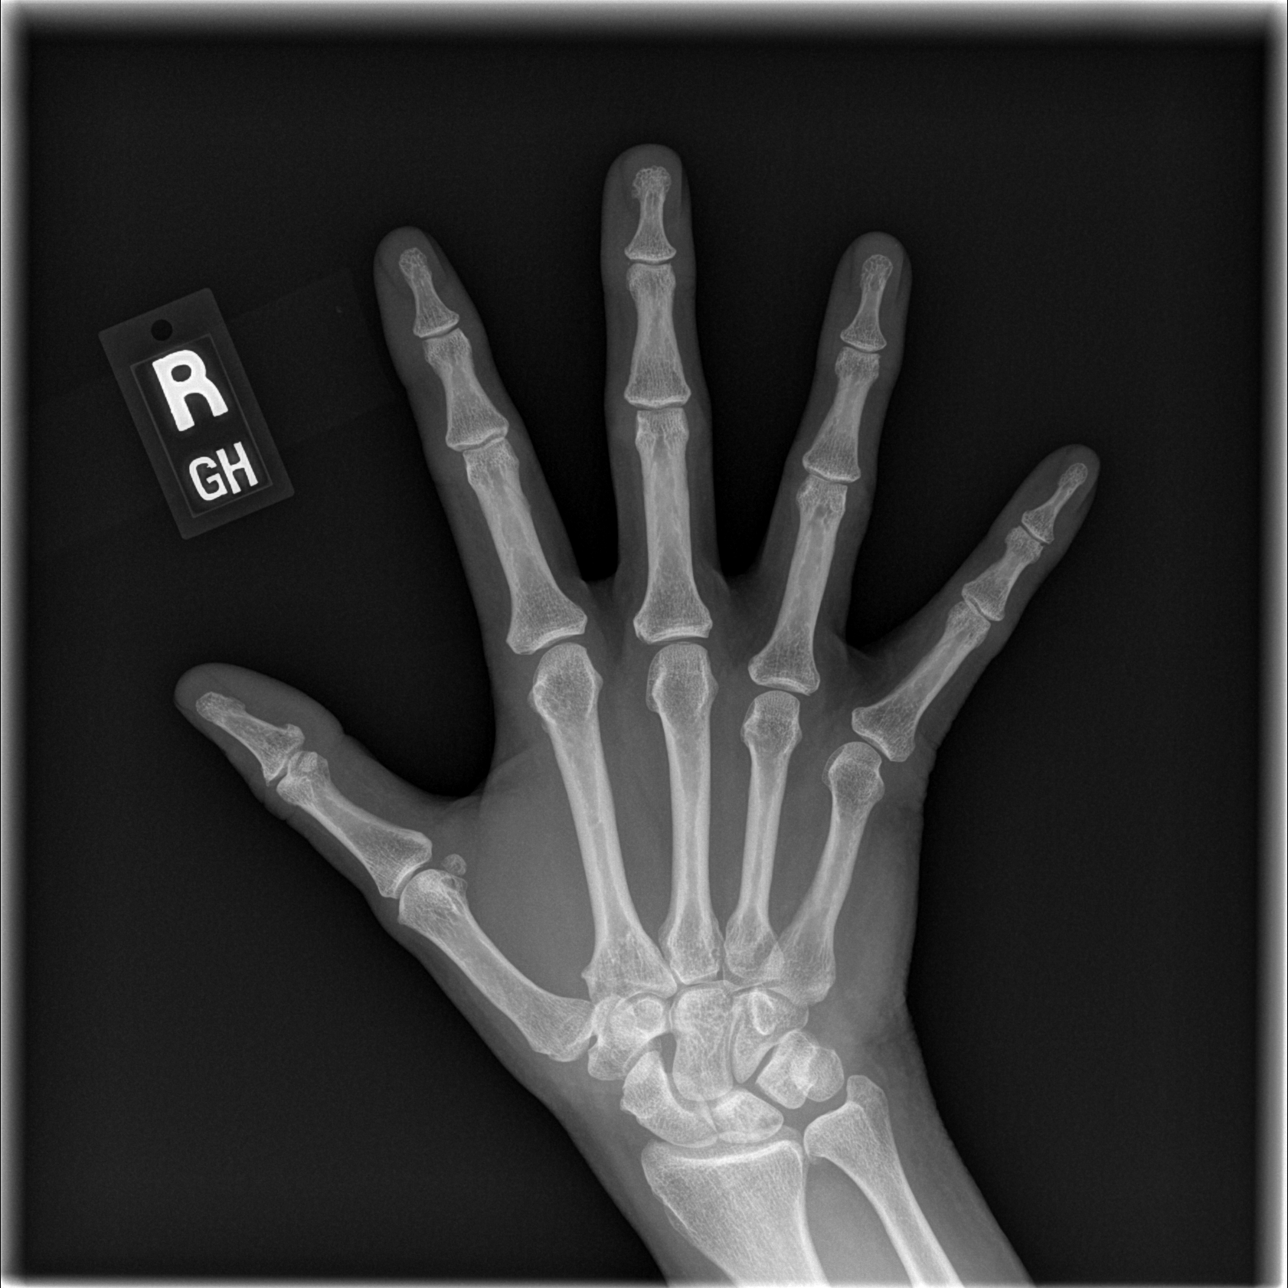

[x hand oblique right]
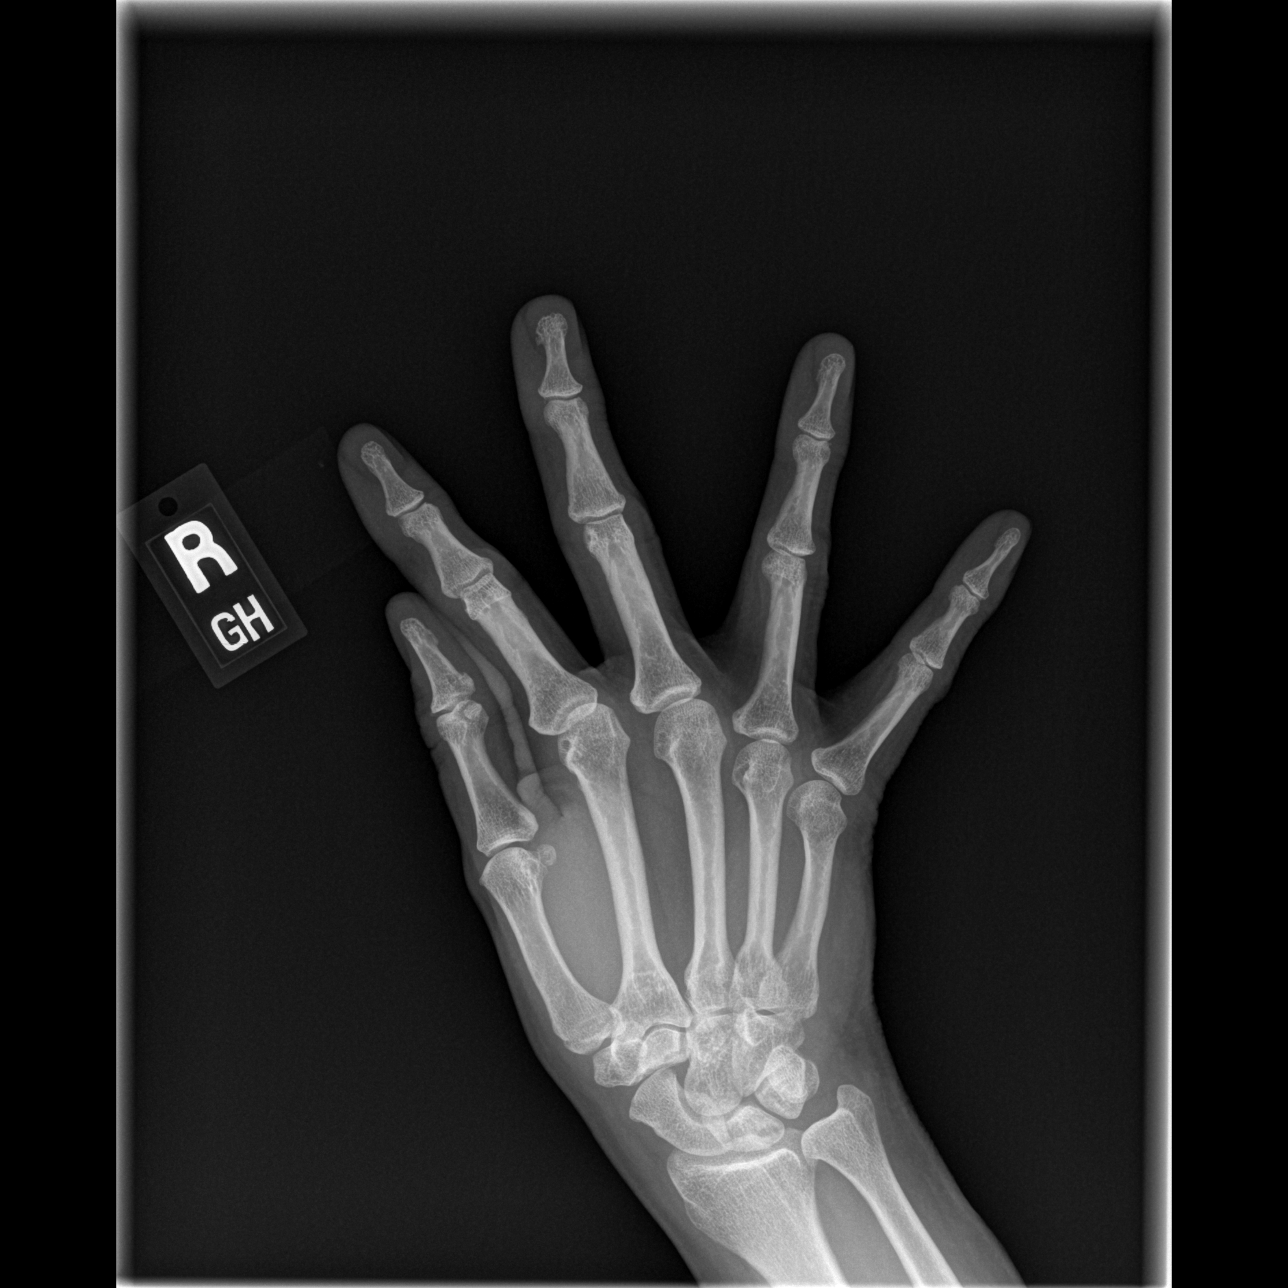

[x hand lat right]
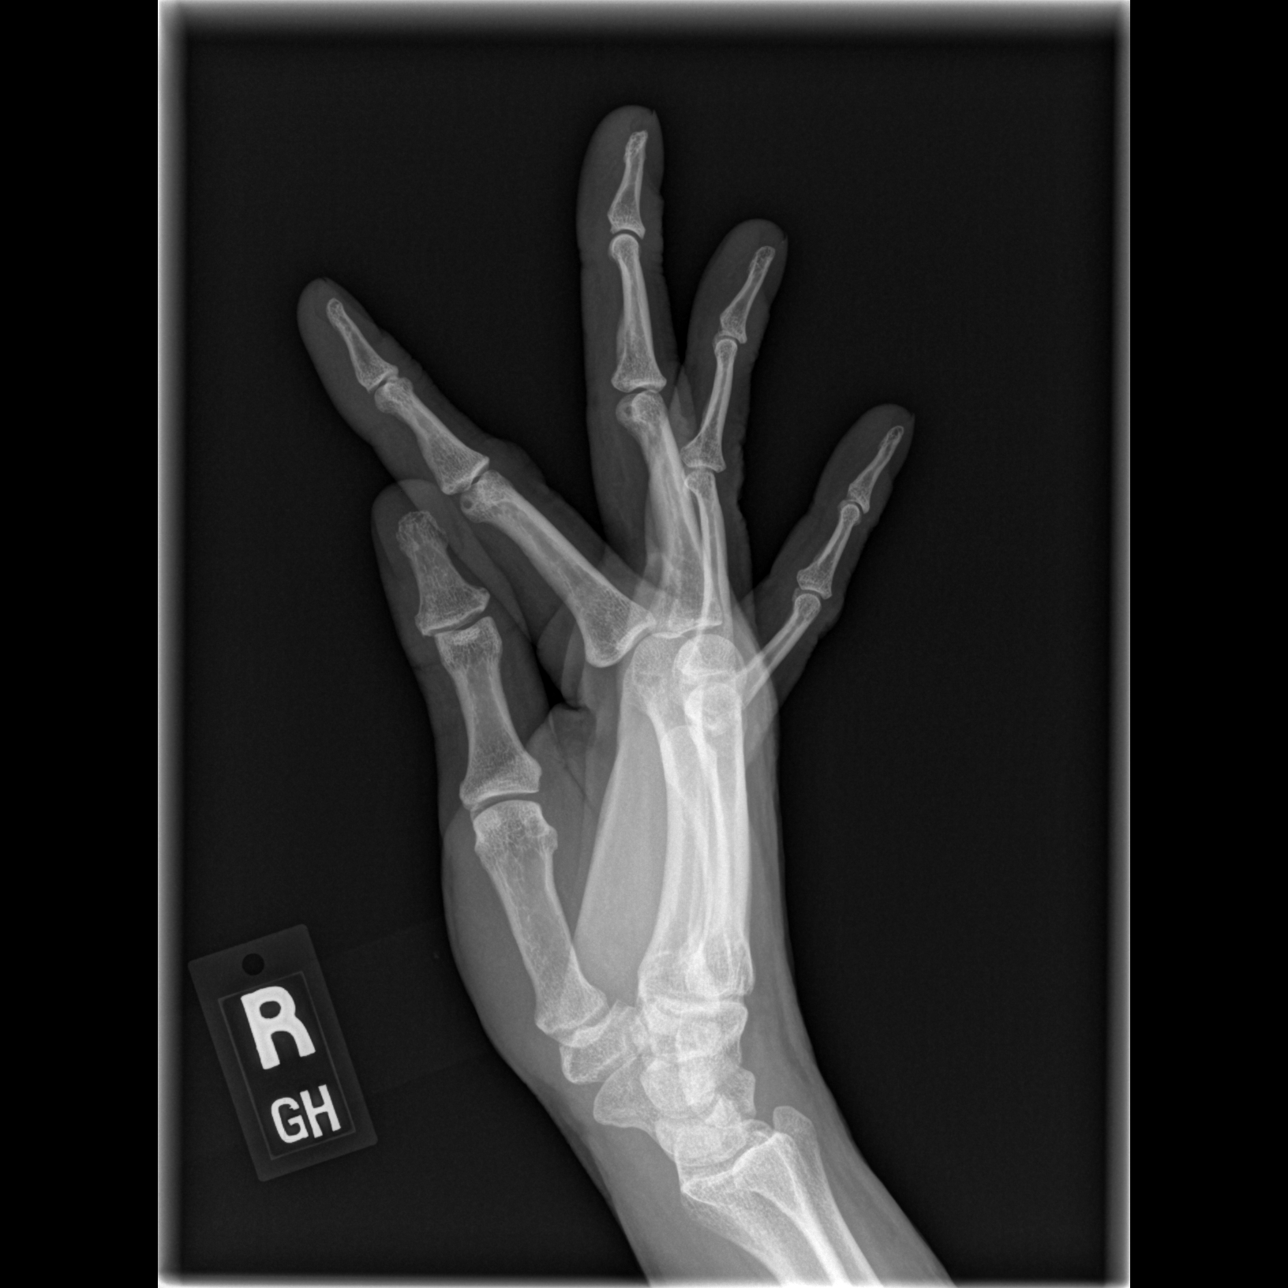

[3 of 3 positions shown; findings below may reference images not displayed]

FINDINGS: There is no evidence of fracture or dislocation. Bony mineralization
is within normal limits. There is no evidence of arthropathy or
other focal bone abnormality. Soft tissues are unremarkable.
IMPRESSION: Negative.
